# Patient Record
Sex: Female | Born: 1965 | Race: Black or African American | Hispanic: No | Smoking: Never smoker
Health system: Southern US, Community
[De-identification: ages and names within clinical notes are randomized; demographics above are authoritative.]

## PROBLEM LIST (undated history)

## (undated) DIAGNOSIS — E079 Disorder of thyroid, unspecified: Secondary | ICD-10-CM

## (undated) DIAGNOSIS — D649 Anemia, unspecified: Secondary | ICD-10-CM

## (undated) DIAGNOSIS — R011 Cardiac murmur, unspecified: Secondary | ICD-10-CM

## (undated) DIAGNOSIS — E7800 Pure hypercholesterolemia, unspecified: Secondary | ICD-10-CM

## (undated) DIAGNOSIS — K219 Gastro-esophageal reflux disease without esophagitis: Secondary | ICD-10-CM

## (undated) HISTORY — DX: Cardiac murmur, unspecified: R01.1

## (undated) HISTORY — DX: Pure hypercholesterolemia, unspecified: E78.00

## (undated) HISTORY — DX: Disorder of thyroid, unspecified: E07.9

## (undated) HISTORY — DX: Gastro-esophageal reflux disease without esophagitis: K21.9

## (undated) HISTORY — DX: Anemia, unspecified: D64.9

---

## 1998-09-11 HISTORY — PX: CHEST WALL TUMOR EXCISION: SUR562

## 2004-08-11 ENCOUNTER — Ambulatory Visit: Payer: Self-pay | Admitting: Pain Medicine

## 2004-10-18 ENCOUNTER — Ambulatory Visit: Payer: Self-pay | Admitting: Pain Medicine

## 2004-12-22 ENCOUNTER — Ambulatory Visit: Payer: Self-pay | Admitting: Pain Medicine

## 2005-02-14 ENCOUNTER — Ambulatory Visit: Payer: Self-pay | Admitting: Pain Medicine

## 2005-03-02 ENCOUNTER — Ambulatory Visit: Payer: Self-pay | Admitting: Internal Medicine

## 2005-07-13 ENCOUNTER — Ambulatory Visit: Payer: Self-pay | Admitting: Pain Medicine

## 2005-09-28 ENCOUNTER — Ambulatory Visit: Payer: Self-pay | Admitting: Pain Medicine

## 2005-12-21 ENCOUNTER — Ambulatory Visit: Payer: Self-pay | Admitting: Pain Medicine

## 2006-03-08 ENCOUNTER — Ambulatory Visit: Payer: Self-pay | Admitting: Pain Medicine

## 2006-04-10 ENCOUNTER — Ambulatory Visit: Payer: Self-pay | Admitting: Internal Medicine

## 2006-05-29 ENCOUNTER — Ambulatory Visit: Payer: Self-pay | Admitting: Pain Medicine

## 2006-08-21 ENCOUNTER — Ambulatory Visit: Payer: Self-pay | Admitting: Pain Medicine

## 2007-02-19 ENCOUNTER — Ambulatory Visit: Payer: Self-pay | Admitting: Pain Medicine

## 2007-04-12 ENCOUNTER — Ambulatory Visit: Payer: Self-pay | Admitting: Internal Medicine

## 2007-06-27 ENCOUNTER — Ambulatory Visit: Payer: Self-pay | Admitting: Pain Medicine

## 2007-10-29 ENCOUNTER — Ambulatory Visit: Payer: Self-pay | Admitting: Pain Medicine

## 2008-01-21 ENCOUNTER — Ambulatory Visit: Payer: Self-pay | Admitting: Pain Medicine

## 2008-04-23 ENCOUNTER — Ambulatory Visit: Payer: Self-pay | Admitting: Pain Medicine

## 2008-04-24 ENCOUNTER — Ambulatory Visit: Payer: Self-pay | Admitting: Internal Medicine

## 2008-08-26 ENCOUNTER — Ambulatory Visit: Payer: Self-pay | Admitting: Pain Medicine

## 2009-01-14 ENCOUNTER — Ambulatory Visit: Payer: Self-pay | Admitting: Pain Medicine

## 2009-02-16 ENCOUNTER — Ambulatory Visit: Payer: Self-pay | Admitting: Pain Medicine

## 2009-05-21 ENCOUNTER — Ambulatory Visit: Payer: Self-pay | Admitting: Internal Medicine

## 2009-07-06 ENCOUNTER — Ambulatory Visit: Payer: Self-pay | Admitting: Pain Medicine

## 2009-07-16 ENCOUNTER — Ambulatory Visit: Payer: Self-pay | Admitting: Pain Medicine

## 2009-08-09 ENCOUNTER — Ambulatory Visit: Payer: Self-pay | Admitting: Pain Medicine

## 2009-10-21 ENCOUNTER — Ambulatory Visit: Payer: Self-pay | Admitting: Pain Medicine

## 2010-05-24 ENCOUNTER — Ambulatory Visit: Payer: Self-pay | Admitting: Internal Medicine

## 2011-06-02 ENCOUNTER — Ambulatory Visit: Payer: Self-pay | Admitting: Obstetrics and Gynecology

## 2012-06-12 ENCOUNTER — Ambulatory Visit: Payer: Self-pay | Admitting: Internal Medicine

## 2012-06-13 ENCOUNTER — Ambulatory Visit: Payer: Self-pay | Admitting: Internal Medicine

## 2014-09-09 DIAGNOSIS — E785 Hyperlipidemia, unspecified: Secondary | ICD-10-CM

## 2014-09-09 DIAGNOSIS — G8929 Other chronic pain: Secondary | ICD-10-CM | POA: Insufficient documentation

## 2014-09-09 DIAGNOSIS — G894 Chronic pain syndrome: Secondary | ICD-10-CM | POA: Insufficient documentation

## 2014-09-09 DIAGNOSIS — M542 Cervicalgia: Secondary | ICD-10-CM | POA: Insufficient documentation

## 2014-09-09 DIAGNOSIS — I10 Essential (primary) hypertension: Secondary | ICD-10-CM

## 2014-09-09 HISTORY — DX: Hyperlipidemia, unspecified: E78.5

## 2014-09-09 HISTORY — DX: Essential (primary) hypertension: I10

## 2014-10-01 ENCOUNTER — Ambulatory Visit: Payer: Self-pay | Admitting: Internal Medicine

## 2014-11-17 ENCOUNTER — Ambulatory Visit: Payer: Self-pay | Admitting: Gastroenterology

## 2015-01-04 LAB — SURGICAL PATHOLOGY

## 2015-10-20 ENCOUNTER — Other Ambulatory Visit: Payer: Self-pay | Admitting: Internal Medicine

## 2015-10-20 DIAGNOSIS — Z1231 Encounter for screening mammogram for malignant neoplasm of breast: Secondary | ICD-10-CM

## 2015-10-21 ENCOUNTER — Encounter: Payer: Self-pay | Admitting: Obstetrics and Gynecology

## 2015-10-21 ENCOUNTER — Ambulatory Visit (INDEPENDENT_AMBULATORY_CARE_PROVIDER_SITE_OTHER): Payer: BLUE CROSS/BLUE SHIELD | Admitting: Obstetrics and Gynecology

## 2015-10-21 VITALS — BP 155/88 | HR 92 | Ht 63.0 in | Wt 141.9 lb

## 2015-10-21 DIAGNOSIS — N951 Menopausal and female climacteric states: Secondary | ICD-10-CM | POA: Diagnosis not present

## 2015-10-21 DIAGNOSIS — Z01419 Encounter for gynecological examination (general) (routine) without abnormal findings: Secondary | ICD-10-CM | POA: Diagnosis not present

## 2015-10-21 DIAGNOSIS — N939 Abnormal uterine and vaginal bleeding, unspecified: Secondary | ICD-10-CM | POA: Diagnosis not present

## 2015-10-21 NOTE — Patient Instructions (Signed)
1.  Pap smear is done today. 2.  Mammogram is already scheduled. 3.  No need for stool guaiac card testing for colon cancer screening because of colonoscopy this year.. 4.  Ultrasound is scheduled for  Evaluation of abnormal uterine bleeding. 5.  Return in 2 weeks for review of ultrasound and endometrial biopsy

## 2015-10-21 NOTE — Progress Notes (Signed)
Patient ID: Gail Harper, female   DOB: 17-Nov-1965, 50 y.o.   MRN: [id number] ANNUAL PREVENTATIVE CARE GYN  ENCOUNTER NOTE  Subjective:       Gail Harper is a 50 y.o. [phone] female here for a routine annual gynecologic exam.  Current complaints: 1.  04/2015- hot flashes at nites, breast tenderness, irregular cycles   Patient presents for annual exam and pap. Last pap was 4-5 years ago which was normal. Denies history of abnormal pap smears. LMP 10/09/15. Prior to September the patient had a regular cycle, occurring every 28 days, lasting 4-5 days with a heavy flow throughout but no dysmenorrhea. In the past two years she missed two periods, otherwise regular cycles. The patient had a regular period in September 2016, but did not have a period in October-December. Starting 09/20/15 she had two days of semi-heavy bleeding. She then had bleeding on 10/09/15 and has spotting since then. She notes possibly having hot flashes in August-September but she is unsure of this. The patient has increased urinary frequency only when anxious, otherwise no urinary complaints. She denies nausea, vomiting, and diarrhea. She occasionally has to strain to have a BM.   Gynecologic History Patient's last menstrual period was 10/09/2015 (approximate). Contraception: none Last Pap: 2014. Results were: normal Last mammogram: 2016. Results were: normal- thru pcp  Obstetric History OB History  Gravida Para Term Preterm AB SAB TAB Ectopic Multiple Living  # Outcome Date GA Lbr Len/2nd Weight Sex Delivery Anes PTL Lv  2 Term 1989   7 lb 1.8 oz (3.225 kg) F Vag-Spont   Y  1 Term 1985   6 lb 6.4 oz (2.903 kg) F Vag-Spont   Y      Past Medical History  Diagnosis Date  . Anemia   . High cholesterol     Past Surgical History  Procedure Laterality Date  . Chest wall tumor excision  2000    b9    No current outpatient prescriptions on file prior to visit.   No current facility-administered  medications on file prior to visit.    No Known Allergies  Social History   Social History  . Marital Status: Married    Spouse Name: N/A  . Number of Children: N/A  . Years of Education: N/A   Occupational History  . Not on file.   Social History Main Topics  . Smoking status: Never Smoker   . Smokeless tobacco: Not on file  . Alcohol Use: No  . Drug Use: No  . Sexual Activity: No   Other Topics Concern  . Not on file   Social History Narrative  . No narrative on file    Family History  Problem Relation Age of Onset  . Heart disease Sister   . Cancer Neg Hx   . Diabetes Neg Hx     The following portions of the patient's history were reviewed and updated as appropriate: allergies, current medications, past family history, past medical history, past social history, past surgical history and problem list.  Review of Systems Review of Systems  Constitutional: Positive for diaphoresis. Negative for fever, chills, weight loss and malaise/fatigue.  Gastrointestinal: Positive for constipation. Negative for nausea, vomiting, abdominal pain and diarrhea.  Genitourinary: Positive for frequency. Negative for dysuria, urgency, hematuria and flank pain.  Psychiatric/Behavioral: The patient is nervous/anxious.     Objective:   BP 155/88 mmHg  Pulse 92  Ht  (1.6 m)  Wt 141 lb 14.4 oz (64.365 kg)  BMI 25.14 kg/m2  LMP 10/09/2015 (Approximate) CONSTITUTIONAL: Well-developed, well-nourished female in no acute distress.  PSYCHIATRIC: Normal mood and affect, slightly anxious. Normal behavior. Normal judgment and thought content. NEUROLGIC: Alert and oriented to person, place, and time. Normal muscle tone coordination. No cranial nerve deficit noted. NECK: Normal range of motion, supple, no masses.  Normal thyroid.  CARDIOVASCULAR: Normal heart rate noted, regular rhythm, no murmur. RESPIRATORY: Clear to auscultation bilaterally. Effort and breath sounds normal, no problems  with respiration noted. BREASTS: Symmetric in size. No masses, skin changes, nipple drainage, or lymphadenopathy. ABDOMEN: Soft, normal bowel sounds, no distention noted.  No tenderness, rebound or guarding.  PELVIC:   External Genitalia: Normal  BUS: Normal  Vagina: Normal; residual blood in vaginal vault  Cervix: Normal; no lesions  Uterus: Normal; midplane, normal size, shape, mobile, nontender  Adnexa: Normal  RV: External Exam NormaI, No Rectal Masses and Normal Sphincter tone  LYMPHATIC: No Axillary, Supraclavicular, or Inguinal Adenopathy.    Assessment:   Annual gynecologic examination 50 y.o. Contraception: none bmi-25 Climacteric. Abnormal uterine bleeding  Plan:  Pap: Pap Co Test Mammogram: thru pcp Stool Guaiac Testing:  Not Indicated- colonoscopy- 11/2014-polyps removed Labs: thru pcp Routine preventative health maintenance measures emphasized: Exercise/Diet/Weight control, Tobacco Warnings, Alcohol/Substance use risks, Stress Management and Safe Sex  1. The patient will schedule a transvaginal ultrasound for further evaluation of AUB 2. Return to the clinic in 2 weeks for review of ultrasound results and endometrial biopsy.  Darol Destine, CMA Gail Lynn PA-S Herold Harms, MD    I have seen, interviewed, and examined the patient in conjunction with the [facility].A. student and affirm the diagnosis and management plan. Martin A. DeFrancesco, MD, FACOG   Note: This dictation was prepared with Dragon dictation along with smaller phrase technology. Any transcriptional errors that result from this process are unintentional.

## 2015-10-27 ENCOUNTER — Ambulatory Visit (INDEPENDENT_AMBULATORY_CARE_PROVIDER_SITE_OTHER): Payer: BLUE CROSS/BLUE SHIELD

## 2015-10-27 DIAGNOSIS — N939 Abnormal uterine and vaginal bleeding, unspecified: Secondary | ICD-10-CM | POA: Diagnosis not present

## 2015-10-29 LAB — PAP IG AND HPV HIGH-RISK
HPV, high-risk: NEGATIVE
PAP Smear Comment: 0

## 2015-11-02 ENCOUNTER — Ambulatory Visit
Admission: RE | Admit: 2015-11-02 | Discharge: 2015-11-02 | Disposition: A | Discharge status: Home / Self Care | Payer: BLUE CROSS/BLUE SHIELD | Source: Ambulatory Visit | Attending: Internal Medicine | Admitting: Internal Medicine

## 2015-11-02 DIAGNOSIS — Z1231 Encounter for screening mammogram for malignant neoplasm of breast: Secondary | ICD-10-CM | POA: Diagnosis not present

## 2015-11-03 ENCOUNTER — Other Ambulatory Visit: Payer: Self-pay | Admitting: Internal Medicine

## 2015-11-03 ENCOUNTER — Encounter: Payer: Self-pay | Admitting: Obstetrics and Gynecology

## 2015-11-03 DIAGNOSIS — R928 Other abnormal and inconclusive findings on diagnostic imaging of breast: Secondary | ICD-10-CM

## 2015-11-04 ENCOUNTER — Ambulatory Visit (INDEPENDENT_AMBULATORY_CARE_PROVIDER_SITE_OTHER): Payer: BLUE CROSS/BLUE SHIELD | Admitting: Obstetrics and Gynecology

## 2015-11-04 ENCOUNTER — Encounter: Payer: Self-pay | Admitting: Obstetrics and Gynecology

## 2015-11-04 VITALS — BP 146/80 | HR 73 | Ht 63.0 in | Wt 141.1 lb

## 2015-11-04 DIAGNOSIS — N83202 Unspecified ovarian cyst, left side: Secondary | ICD-10-CM

## 2015-11-04 DIAGNOSIS — R9389 Abnormal findings on diagnostic imaging of other specified body structures: Secondary | ICD-10-CM | POA: Insufficient documentation

## 2015-11-04 DIAGNOSIS — N939 Abnormal uterine and vaginal bleeding, unspecified: Secondary | ICD-10-CM | POA: Diagnosis not present

## 2015-11-04 DIAGNOSIS — R938 Abnormal findings on diagnostic imaging of other specified body structures: Secondary | ICD-10-CM

## 2015-11-04 NOTE — Progress Notes (Signed)
Chief complaint: 1. Abnormal uterine bleeding 2. Follow-up on ultrasound 3. Endometrial biopsy  Recent ultrasound demonstrated a normal uterus with a thickened cystic endometrium. Biopsy is recommended. A simple left ovarian cyst is also identified. These results were discussed with patient.  OBJECTIVE:. BP 146/80 mmHg  Pulse 73  Ht  (1.6 m)  Wt 141 lb 1.6 oz (64.003 kg)  BMI 25.00 kg/m2  LMP 10/31/2015 (Exact Date) Pleasant African-American female in no acute distress Pelvic exam: Bimanual exam-midplane uterus, normal size and shape, mobile, nontender; adnexa nonpalpable  PROCEDURE: Endometrial biopsy Patient is consented for the procedure. Indications: Abnormal uterine bleeding; thickened endometrium on ultrasound Bimanual exam-midplane uterus, normal size and shape Uterine sounding-8.5 cm Endometrial biopsy-lush endometrium on a single pass with a 3 mm Mylex pipette EBL-minimal Procedure well-tolerated Specimen sent to pathology.  ASSESSMENT: 1. Thickened endometrium on ultrasound 2. Abnormal uterine bleeding 3. Simple left ovarian cyst  PLAN: 1. Endometrial biopsy as noted 2. Return in 10 days for follow-up and further management planning 3. Post biopsy instructions given.  A total of 15 minutes were spent face-to-face with the patient during this encounter and over half of that time dealt with counseling and coordination of care.  Herold Harms, MD  Note: This dictation was prepared with Dragon dictation along with smaller phrase technology. Any transcriptional errors that result from this process are unintentional.

## 2015-11-04 NOTE — Patient Instructions (Signed)
ENDOMETRIAL BIOPSY POST-PROCEDURE INSTRUCTIONS  1. You may take Ibuprofen, Aleve or Tylenol for pain if needed.  Cramping should resolve within in 24 hours.  2. You may have a small amount of spotting.  You should wear a mini pad for the next few days.  3. You may have intercourse after 24 hours.  4. You need to call if you have any pelvic pain, fever, heavy bleeding or foul smelling vaginal discharge.  5. Shower or bathe as normal   We will discuss the results at your follow-up appointment in 10 days.

## 2015-11-05 ENCOUNTER — Ambulatory Visit
Admission: RE | Admit: 2015-11-05 | Discharge: 2015-11-05 | Disposition: A | Discharge status: Home / Self Care | Payer: BLUE CROSS/BLUE SHIELD | Source: Ambulatory Visit | Attending: Internal Medicine | Admitting: Internal Medicine

## 2015-11-05 DIAGNOSIS — R928 Other abnormal and inconclusive findings on diagnostic imaging of breast: Secondary | ICD-10-CM

## 2015-11-05 DIAGNOSIS — N6489 Other specified disorders of breast: Secondary | ICD-10-CM | POA: Insufficient documentation

## 2015-11-08 LAB — PATHOLOGY

## 2015-11-16 ENCOUNTER — Encounter: Payer: Self-pay | Admitting: Obstetrics and Gynecology

## 2015-11-16 ENCOUNTER — Ambulatory Visit (INDEPENDENT_AMBULATORY_CARE_PROVIDER_SITE_OTHER): Payer: BLUE CROSS/BLUE SHIELD | Admitting: Obstetrics and Gynecology

## 2015-11-16 VITALS — BP 132/79 | HR 79 | Wt 141.3 lb

## 2015-11-16 DIAGNOSIS — N939 Abnormal uterine and vaginal bleeding, unspecified: Secondary | ICD-10-CM | POA: Diagnosis not present

## 2015-11-16 DIAGNOSIS — N951 Menopausal and female climacteric states: Secondary | ICD-10-CM | POA: Diagnosis not present

## 2015-11-16 NOTE — Progress Notes (Signed)
Chief complaint: 1. Abnormal uterine bleeding 2. Vasomotor symptoms  Patient presents for follow-up on endometrial biopsy. Ultrasound demonstrated a thickened endometrium in a simple left ovarian cyst.   Endometrial biopsy demonstrating secretory endometrium without hyperplasia or carcinoma.  Patient is experiencing occasional vasomotor symptoms.  ASSESSMENT: 1. Abnormal uterine bleeding with benign endometrial biopsy. Findings are consistent with ovulatory function. 2. Vasomotor symptoms consistent with climacteric phase  PLAN: 1. Menstrual calendar monitoring 2. Return in September for review bleeding pattern and vasomotor symptoms 3. Reassurance is given regarding the benign nature of the results of biopsy.   A total of 15 minutes were spent face-to-face with the patient during this encounter and over half of that time dealt with counseling and coordination of care.  Herold HarmsMartin A Sekou Zuckerman, MD  Note: This dictation was prepared with Dragon dictation along with smaller phrase technology. Any transcriptional errors that result from this process are unintentional.

## 2015-11-16 NOTE — Patient Instructions (Signed)
1. Maintain menstrual calendar monitoring 2. Follow-up in September 2017 for review bleeding pattern and vasomotor symptoms

## 2016-05-17 ENCOUNTER — Ambulatory Visit (INDEPENDENT_AMBULATORY_CARE_PROVIDER_SITE_OTHER): Payer: BLUE CROSS/BLUE SHIELD | Admitting: Obstetrics and Gynecology

## 2016-05-17 ENCOUNTER — Encounter: Payer: Self-pay | Admitting: Obstetrics and Gynecology

## 2016-05-17 VITALS — BP 136/80 | HR 78 | Ht 63.0 in | Wt 146.2 lb

## 2016-05-17 DIAGNOSIS — R938 Abnormal findings on diagnostic imaging of other specified body structures: Secondary | ICD-10-CM

## 2016-05-17 DIAGNOSIS — R9389 Abnormal findings on diagnostic imaging of other specified body structures: Secondary | ICD-10-CM

## 2016-05-17 DIAGNOSIS — N950 Postmenopausal bleeding: Secondary | ICD-10-CM

## 2016-05-17 NOTE — Patient Instructions (Signed)
1. Ultrasound is scheduled to assess endometrial thickness 2. Continue menstrual calendar monitoring 3. Return in 6 months for follow-up 4. Results from ultrasound will be given through my chart. If there is a need for endometrial biopsy you will be notified regarding this need.

## 2016-05-17 NOTE — Progress Notes (Signed)
Chief complaint: 1. History of postmenopausal bleeding 2. History of thickened endometrium  Endometrial biopsy in March 2017 was benign. Ultrasound at that time demonstrated a thickened cystic endometrium. Menstrual calendar monitoring since March has demonstrated occasional spotting with that heavy bleeding. She is experiencing occasional vasomotor symptoms.  Past medical history, past surgical history, problem list, medications, and allergies are reviewed  OBJECTIVE: BP 136/80   Pulse 78   Ht 5\' 3"  (1.6 m)   Wt 146 lb 3.2 oz (66.3 kg)   LMP 02/10/2016 (Approximate) Comment: spotting only  BMI 25.90 kg/m  Physical exam-deferred  ASSESSMENT: 1. History of postmenopausal bleeding and benign endometrial biopsy 6 months ago 2. History of thickened cystic endometrium on ultrasound 6 months ago  PLAN: 1. Pelvic ultrasound 2. Continue menstrual calendar monitoring 3. Return in 6 months for follow-up 4. If the endometrium is still thickened on ultrasound, we will have patient return for repeat endometrial biopsy  A total of 15 minutes were spent face-to-face with the patient during this encounter and over half of that time dealt with counseling and coordination of care.  Herold HarmsMartin A Tredarius Cobern, MD  Note: This dictation was prepared with Dragon dictation along with smaller phrase technology. Any transcriptional errors that result from this process are unintentional.

## 2016-05-18 ENCOUNTER — Ambulatory Visit: Payer: BLUE CROSS/BLUE SHIELD | Admitting: Obstetrics and Gynecology

## 2016-05-23 ENCOUNTER — Encounter: Payer: Self-pay | Admitting: Obstetrics and Gynecology

## 2016-05-24 ENCOUNTER — Ambulatory Visit (INDEPENDENT_AMBULATORY_CARE_PROVIDER_SITE_OTHER): Payer: BLUE CROSS/BLUE SHIELD

## 2016-05-24 DIAGNOSIS — R938 Abnormal findings on diagnostic imaging of other specified body structures: Secondary | ICD-10-CM | POA: Diagnosis not present

## 2016-05-24 DIAGNOSIS — R9389 Abnormal findings on diagnostic imaging of other specified body structures: Secondary | ICD-10-CM

## 2016-05-24 DIAGNOSIS — N950 Postmenopausal bleeding: Secondary | ICD-10-CM

## 2016-06-15 ENCOUNTER — Ambulatory Visit: Payer: BLUE CROSS/BLUE SHIELD | Admitting: Obstetrics and Gynecology

## 2016-06-27 ENCOUNTER — Ambulatory Visit (INDEPENDENT_AMBULATORY_CARE_PROVIDER_SITE_OTHER): Payer: BLUE CROSS/BLUE SHIELD | Admitting: Obstetrics and Gynecology

## 2016-06-27 ENCOUNTER — Encounter: Payer: Self-pay | Admitting: Obstetrics and Gynecology

## 2016-06-27 VITALS — BP 147/79 | HR 76 | Ht 63.0 in | Wt 144.1 lb

## 2016-06-27 DIAGNOSIS — N950 Postmenopausal bleeding: Secondary | ICD-10-CM

## 2016-06-27 DIAGNOSIS — R938 Abnormal findings on diagnostic imaging of other specified body structures: Secondary | ICD-10-CM | POA: Diagnosis not present

## 2016-06-27 DIAGNOSIS — R9389 Abnormal findings on diagnostic imaging of other specified body structures: Secondary | ICD-10-CM

## 2016-06-27 NOTE — Patient Instructions (Signed)
1. Endometrial biopsy is done today 2. Maintain menstrual calendar monitoring 3. Return in 6 months for follow-up on postmenopausal bleeding   Endometrial Biopsy, Care After Refer to this sheet in the next few weeks. These instructions provide you with information on caring for yourself after your procedure. Your health care provider may also give you more specific instructions. Your treatment has been planned according to current medical practices, but problems sometimes occur. Call your health care provider if you have any problems or questions after your procedure. WHAT TO EXPECT AFTER THE PROCEDURE After your procedure, it is typical to have the following:  You may have mild cramping and a small amount of vaginal bleeding for a few days after the procedure. This is normal. HOME CARE INSTRUCTIONS  Only take over-the-counter or prescription medicine as directed by your health care provider.  Do not douche, use tampons, or have sexual intercourse until your health care provider approves.  Follow your health care provider's instructions regarding any activity restrictions, such as strenuous exercise or heavy lifting. SEEK MEDICAL CARE IF:  You have heavy bleeding or bleeding longer than 2 days after the procedure.  You have bad smelling drainage from your vagina.  You have a fever and chills.  Youhave severe lower stomach (abdominal) pain. SEEK IMMEDIATE MEDICAL CARE IF:  You have severe cramps in your stomach or back.  You pass large blood clots.  Your bleeding increases.  You become weak or lightheaded, or you pass out.   This information is not intended to replace advice given to you by your health care provider. Make sure you discuss any questions you have with your health care provider.   Document Released: 06/18/2013 Document Reviewed: 06/18/2013 Elsevier Interactive Patient Education [employer]2016 Elsevier Inc.

## 2016-06-27 NOTE — Progress Notes (Signed)
Chief complaint: 1. Postmenopausal bleeding 2. Thickened endometrium on ultrasound  Endometrial Biopsy Procedure Note  Pre-operative Diagnosis: Postmenopausal bleeding; thickened endometrium  Post-operative Diagnosis: Same  Procedure Details   Urine pregnancy test was not done.  The risks (including infection, bleeding, pain, and uterine perforation) and benefits of the procedure were explained to the patient and Verbal informed consent was obtained.  Antibiotic prophylaxis against endocarditis was not indicated.   The patient was placed in the dorsal lithotomy position.  Bimanual exam showed the uterus to be in the neutral position.  A Graves' speculum inserted in the vagina, and the cervix prepped with povidone iodine.  Endocervical curettage with a Kevorkian curette was not performed.   A sharp tenaculum was not applied to the anterior lip of the cervix for stabilization.  A sterile uterine sound was used to sound the uterus to a depth of 8cm.  A Mylex 3mm curette was used to sample the endometrium.  Sample was sent for pathologic examination.  Condition: Stable  Complications: None  Plan:  The patient was advised to call for any fever or for prolonged or severe pain or bleeding. She was advised to use OTC acetaminophen and OTC ibuprofen as needed for mild to moderate pain. She was advised to avoid vaginal intercourse for 48 hours or until the bleeding has completely stopped.  Attending Physician Documentation: Prentice DockerMartin A Noelie Renfrow, MD   OBJECTIVE: BP (!) 147/79   Pulse 76   Ht 5\' 3"  (1.6 m)   Wt 144 lb 1.6 oz (65.4 kg)   LMP 05/22/2016   BMI 25.53 kg/m  Endometrial biopsy-see note  ASSESSMENT: 1. Postmenopausal bleeding 2. Thickened endometrium on ultrasound  PLAN: 1. Endometrial biopsy performed 2. Maintain menstrual calendar monitoring 3. Return in 6 months for follow-up on postmenopausal bleeding 4. If bleeding abnormality persists, consider  D&C/hysteroscopy  Herold HarmsMartin A Hamna Asa, MD  Note: This dictation was prepared with Dragon dictation along with smaller phrase technology. Any transcriptional errors that result from this process are unintentional.   Note: This dictation was prepared with Dragon dictation along with smaller phrase technology. Any transcriptional errors that result from this process are unintentional.

## 2016-06-29 LAB — PATHOLOGY

## 2016-10-23 ENCOUNTER — Encounter: Payer: Self-pay | Admitting: Obstetrics and Gynecology

## 2016-10-23 NOTE — Progress Notes (Signed)
Patient ID: Gail Harper, female   DOB: [birth year], 51 y.o.   MRN: [id number] ANNUAL PREVENTATIVE CARE GYN  ENCOUNTER NOTE  Subjective:       Gail Harper is a 51 y.o. [phone] female here for a routine annual gynecologic exam.  Current complaints: 1. Left breast itching around the nipple;Symptoms began approximately 2 weeks ago; no recent treatment; due for mammogram  2. husband died 09/02/16; complications of dementia 3. History of abnormal uterine bleeding; endometrial biopsy in October 2017 notable for proliferative endometrium; menstrual calendar monitoring demonstrates 3 months of amenorrhea in November/December/January and a normal cycle in February   Gynecologic History No LMP recorded. Contraception: none Last Pap: 10/21/2015 neg/neg. Results were: normal Last mammogram: 11/02/2015 birad 0, 11/05/2015 dx of left breast birad 1. Results were:  Obstetric History OB History  Gravida Para Term Preterm AB Living  2 2 2     2   SAB TAB Ectopic Multiple Live Births          2    # Outcome Date GA Lbr Len/2nd Weight Sex Delivery Anes PTL Lv  2 Term 1989   7 lb 1.8 oz (3.225 kg) F Vag-Spont   LIV  1 Term 1985   6 lb 6.4 oz (2.903 kg) F Vag-Spont   LIV      Past Medical History:  Diagnosis Date  . Anemia   . High cholesterol     Past Surgical History:  Procedure Laterality Date  . 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    Current Outpatient Prescriptions on File Prior to Visit  Medication Sig Dispense Refill  . atorvastatin (LIPITOR) 10 MG tablet TAKE 1 TABLET BY MOUTH EVERY DAY FOR CHOLESTEROL  1  . ferrous sulfate 325 (65 FE) MG tablet Take 325 mg by mouth daily with breakfast.     No current facility-administered medications on file prior to visit.     No Known Allergies  Social History   Social History  . Marital status: Married    Spouse name: N/A  . Number of children: N/A  . Years of education: N/A   Occupational History  . Not on file.   Social History  Main Topics  . Smoking status: Never Smoker  . Smokeless tobacco: Never Used  . Alcohol use No  . Drug use: No  . Sexual activity: No   Other Topics Concern  . Not on file   Social History Narrative  . No narrative on file    Family History  Problem Relation Age of Onset  . Heart disease Sister   . Cancer Neg Hx   . Diabetes Neg Hx   . Breast cancer Neg Hx     The following portions of the patient's history were reviewed and updated as appropriate: allergies, current medications, past family history, past medical history, past social history, past surgical history and problem list.  Review of Systems Review of Systems  Constitutional: Negative for chills, diaphoresis, fever, malaise/fatigue and weight loss.  Eyes: Negative.   Respiratory: Negative.   Cardiovascular: Negative.   Gastrointestinal: Positive for constipation. Negative for abdominal pain, diarrhea, nausea and vomiting.  Genitourinary: Negative.  Negative for dysuria, flank pain, frequency, hematuria and urgency.  Musculoskeletal: Negative.   Skin: Positive for itching.       Breast areola itching  Neurological: Negative.   Endo/Heme/Allergies: Negative.     Objective:    BP (!) 146/73   Pulse 81   Ht 5'  3" (1.6 m)   Wt 144 lb 11.2 oz (65.6 kg)   LMP 10/17/2016 (Exact Date)   BMI 25.63 kg/m   CONSTITUTIONAL: Well-developed, well-nourished female in no acute distress.  PSYCHIATRIC: Normal mood and affect, slightly anxious. Normal behavior. Normal judgment and thought content. NEUROLGIC: Alert and oriented to person, place, and time. Normal muscle tone coordination. No cranial nerve deficit noted. NECK: Normal range of motion, supple, no masses.  Normal thyroid.  CARDIOVASCULAR: Normal heart rate noted, regular rhythm, no murmur. RESPIRATORY: Clear to auscultation bilaterally. Effort and breath sounds normal, no problems with respiration noted. BREASTS: Symmetric in size. No masses,nipple drainage, or  lymphadenopathy. The left peri areola demonstrates slight hyperemia in the upper outer quadrant ABDOMEN: Soft, normal bowel sounds, no distention noted.  No tenderness, rebound or guarding.  PELVIC:   External Genitalia: Normal  BUS: Normal  Vagina: Normal; no discharge  Cervix: Normal; no lesions; parous, no cervical motion tenderness  Uterus: Normal; midplane, normal size, shape, mobile, nontender  Adnexa: Normal  RV: External Exam NormaI, No Rectal Masses and Normal Sphincter tone from stool rectal vault LYMPHATIC: No Axillary, Supraclavicular, or Inguinal Adenopathy. MUSCULOSKELETAL: No tenderness or edema    Assessment:   Annual gynecologic examination 51 y.o. Contraception: none bmi-25 Climacteric. Abnormal uterine bleeding; menstrual calendar monitoring normal since endometrial biopsy in October 2017 Left breast itching with minimal hyperemia present Loss of spouse December 2017 due to complications of dementia  Plan:  Pap: due 2020 Mammogram: thru pcp Stool Guaiac Testing: stool cards Labs: thru pcp Routine preventative health maintenance measures emphasized: Exercise/Diet/Weight control, Tobacco Warnings, Alcohol/Substance use risks, Stress Management and Safe Sex  Continue menstrual calendar monitoring Continue with Tums as source of calcium Apply hydrocortisone cream topically to the left breast several times a day for the next 7-10 days; if symptoms persist return for reevaluation   Darol Destinerystal Anjolaoluwa Siguenza, CMA  Herold HarmsMartin A Defrancesco, MD  Note: This dictation was prepared with Dragon dictation along with smaller phrase technology. Any transcriptional errors that result from this process are unintentional.

## 2016-10-24 ENCOUNTER — Encounter: Payer: Self-pay | Admitting: Obstetrics and Gynecology

## 2016-10-24 ENCOUNTER — Ambulatory Visit (INDEPENDENT_AMBULATORY_CARE_PROVIDER_SITE_OTHER): Payer: BLUE CROSS/BLUE SHIELD | Admitting: Obstetrics and Gynecology

## 2016-10-24 VITALS — BP 146/73 | HR 81 | Ht 63.0 in | Wt 144.7 lb

## 2016-10-24 DIAGNOSIS — Z01419 Encounter for gynecological examination (general) (routine) without abnormal findings: Secondary | ICD-10-CM | POA: Diagnosis not present

## 2016-10-24 DIAGNOSIS — Z1211 Encounter for screening for malignant neoplasm of colon: Secondary | ICD-10-CM | POA: Diagnosis not present

## 2016-10-24 DIAGNOSIS — N939 Abnormal uterine and vaginal bleeding, unspecified: Secondary | ICD-10-CM | POA: Diagnosis not present

## 2016-10-24 DIAGNOSIS — Z1231 Encounter for screening mammogram for malignant neoplasm of breast: Secondary | ICD-10-CM

## 2016-10-24 DIAGNOSIS — Z1239 Encounter for other screening for malignant neoplasm of breast: Secondary | ICD-10-CM

## 2016-10-24 DIAGNOSIS — N951 Menopausal and female climacteric states: Secondary | ICD-10-CM

## 2016-10-24 NOTE — Patient Instructions (Signed)
1. No Pap smear needed until 2020 2. Mammogram ordered 3. Stool guaiac cards are given 4. Screening labs are done through primary care 5. Continue taking Tums as a source of calcium supplementation 6. Continue with healthy eating and exercise 7. Return in 1 year for physical 8. Continue menstrual calendar monitoring-history of irregular menstrual cycles with benign biopsy in October 2017 9. Recommend hydrocortisone cream to be applied topically to the left breast several times a day for the next week to 10 days; if symptoms persist return for follow-up  Health Maintenance, Female Introduction Adopting a healthy lifestyle and getting preventive care can go a long way to promote health and wellness. Talk with your health care provider about what schedule of regular examinations is right for you. This is a good chance for you to check in with your provider about disease prevention and staying healthy. In between checkups, there are plenty of things you can do on your own. Experts have done a lot of research about which lifestyle changes and preventive measures are most likely to keep you healthy. Ask your health care provider for more information. Weight and diet Eat a healthy diet  Be sure to include plenty of vegetables, fruits, low-fat dairy products, and lean protein.  Do not eat a lot of foods high in solid fats, added sugars, or salt.  Get regular exercise. This is one of the most important things you can do for your health.  Most adults should exercise for at least 150 minutes each week. The exercise should increase your heart rate and make you sweat (moderate-intensity exercise).  Most adults should also do strengthening exercises at least twice a week. This is in addition to the moderate-intensity exercise. Maintain a healthy weight  Body mass index (BMI) is a measurement that can be used to identify possible weight problems. It estimates body fat based on height and weight. Your  health care provider can help determine your BMI and help you achieve or maintain a healthy weight.  For females 20 years of age and older:  A BMI below 18.5 is considered underweight.  A BMI of 18.5 to 24.9 is normal.  A BMI of 25 to 29.9 is considered overweight.  A BMI of 30 and above is considered obese. Watch levels of cholesterol and blood lipids  You should start having your blood tested for lipids and cholesterol at 53 years of age, then have this test every 5 years.  You may need to have your cholesterol levels checked more often if:  Your lipid or cholesterol levels are high.  You are older than 53 years of age.  You are at high risk for heart disease. Cancer screening Lung Cancer  Lung cancer screening is recommended for adults 48-38 years old who are at high risk for lung cancer because of a history of smoking.  A yearly low-dose CT scan of the lungs is recommended for people who:  Currently smoke.  Have quit within the past 15 years.  Have at least a 30-pack-year history of smoking. A pack year is smoking an average of one pack of cigarettes a day for 1 year.  Yearly screening should continue until it has been 15 years since you quit.  Yearly screening should stop if you develop a health problem that would prevent you from having lung cancer treatment. Breast Cancer  Practice breast self-awareness. This means understanding how your breasts normally appear and feel.  It also means doing regular breast self-exams. Let your  health care provider know about any changes, no matter how small.  If you are in your 20s or 30s, you should have a clinical breast exam (CBE) by a health care provider every 1-3 years as part of a regular health exam.  If you are 57 or older, have a CBE every year. Also consider having a breast X-ray (mammogram) every year.  If you have a family history of breast cancer, talk to your health care provider about genetic screening.  If you  are at high risk for breast cancer, talk to your health care provider about having an MRI and a mammogram every year.  Breast cancer gene (BRCA) assessment is recommended for women who have family members with BRCA-related cancers. BRCA-related cancers include:  Breast.  Ovarian.  Tubal.  Peritoneal cancers.  Results of the assessment will determine the need for genetic counseling and BRCA1 and BRCA2 testing. Cervical Cancer  Your health care provider may recommend that you be screened regularly for cancer of the pelvic organs (ovaries, uterus, and vagina). This screening involves a pelvic examination, including checking for microscopic changes to the surface of your cervix (Pap test). You may be encouraged to have this screening done every 3 years, beginning at age 70.  For women ages 60-65, health care providers may recommend pelvic exams and Pap testing every 3 years, or they may recommend the Pap and pelvic exam, combined with testing for human papilloma virus (HPV), every 5 years. Some types of HPV increase your risk of cervical cancer. Testing for HPV may also be done on women of any age with unclear Pap test results.  Other health care providers may not recommend any screening for nonpregnant women who are considered low risk for pelvic cancer and who do not have symptoms. Ask your health care provider if a screening pelvic exam is right for you.  If you have had past treatment for cervical cancer or a condition that could lead to cancer, you need Pap tests and screening for cancer for at least 20 years after your treatment. If Pap tests have been discontinued, your risk factors (such as having a new sexual partner) need to be reassessed to determine if screening should resume. Some women have medical problems that increase the chance of getting cervical cancer. In these cases, your health care provider may recommend more frequent screening and Pap tests. Colorectal Cancer  This type of  cancer can be detected and often prevented.  Routine colorectal cancer screening usually begins at 53 years of age and continues through 53 years of age.  Your health care provider may recommend screening at an earlier age if you have risk factors for colon cancer.  Your health care provider may also recommend using home test kits to check for hidden blood in the stool.  A small camera at the end of a tube can be used to examine your colon directly (sigmoidoscopy or colonoscopy). This is done to check for the earliest forms of colorectal cancer.  Routine screening usually begins at age 79.  Direct examination of the colon should be repeated every 5-10 years through 53 years of age. However, you may need to be screened more often if early forms of precancerous polyps or small growths are found. Skin Cancer  Check your skin from head to toe regularly.  Tell your health care provider about any new moles or changes in moles, especially if there is a change in a mole's shape or color.  Also tell your  health care provider if you have a mole that is larger than the size of a pencil eraser.  Always use sunscreen. Apply sunscreen liberally and repeatedly throughout the day.  Protect yourself by wearing long sleeves, pants, a wide-brimmed hat, and sunglasses whenever you are outside. Heart disease, diabetes, and high blood pressure  High blood pressure causes heart disease and increases the risk of stroke. High blood pressure is more likely to develop in:  People who have blood pressure in the high end of the normal range (130-139/85-89 mm Hg).  People who are overweight or obese.  People who are African American.  If you are 69-1 years of age, have your blood pressure checked every 3-5 years. If you are 35 years of age or older, have your blood pressure checked every year. You should have your blood pressure measured twice-once when you are at a hospital or clinic, and once when you are not  at a hospital or clinic. Record the average of the two measurements. To check your blood pressure when you are not at a hospital or clinic, you can use:  An automated blood pressure machine at a pharmacy.  A home blood pressure monitor.  If you are between 41 years and 8 years old, ask your health care provider if you should take aspirin to prevent strokes.  Have regular diabetes screenings. This involves taking a blood sample to check your fasting blood sugar level.  If you are at a normal weight and have a low risk for diabetes, have this test once every three years after 53 years of age.  If you are overweight and have a high risk for diabetes, consider being tested at a younger age or more often. Preventing infection Hepatitis B  If you have a higher risk for hepatitis B, you should be screened for this virus. You are considered at high risk for hepatitis B if:  You were born in a country where hepatitis B is common. Ask your health care provider which countries are considered high risk.  Your parents were born in a high-risk country, and you have not been immunized against hepatitis B (hepatitis B vaccine).  You have HIV or AIDS.  You use needles to inject street drugs.  You live with someone who has hepatitis B.  You have had sex with someone who has hepatitis B.  You get hemodialysis treatment.  You take certain medicines for conditions, including cancer, organ transplantation, and autoimmune conditions. Hepatitis C  Blood testing is recommended for:  Everyone born from [birth year] through 1967.  Anyone with known risk factors for hepatitis C. Sexually transmitted infections (STIs)  You should be screened for sexually transmitted infections (STIs) including gonorrhea and chlamydia if:  You are sexually active and are younger than 53 years of age.  You are older than 53 years of age and your health care provider tells you that you are at risk for this type of  infection.  Your sexual activity has changed since you were last screened and you are at an increased risk for chlamydia or gonorrhea. Ask your health care provider if you are at risk.  If you do not have HIV, but are at risk, it may be recommended that you take a prescription medicine daily to prevent HIV infection. This is called pre-exposure prophylaxis (PrEP). You are considered at risk if:  You are sexually active and do not regularly use condoms or know the HIV status of your partner(s).  You take drugs  by injection.  You are sexually active with a partner who has HIV. Talk with your health care provider about whether you are at high risk of being infected with HIV. If you choose to begin PrEP, you should first be tested for HIV. You should then be tested every 3 months for as long as you are taking PrEP. Pregnancy  If you are premenopausal and you may become pregnant, ask your health care provider about preconception counseling.  If you may become pregnant, take 400 to 800 micrograms (mcg) of folic acid every day.  If you want to prevent pregnancy, talk to your health care provider about birth control (contraception). Osteoporosis and menopause  Osteoporosis is a disease in which the bones lose minerals and strength with aging. This can result in serious bone fractures. Your risk for osteoporosis can be identified using a bone density scan.  If you are 62 years of age or older, or if you are at risk for osteoporosis and fractures, ask your health care provider if you should be screened.  Ask your health care provider whether you should take a calcium or vitamin D supplement to lower your risk for osteoporosis.  Menopause may have certain physical symptoms and risks.  Hormone replacement therapy may reduce some of these symptoms and risks. Talk to your health care provider about whether hormone replacement therapy is right for you. Follow these instructions at home:  Schedule  regular health, dental, and eye exams.  Stay current with your immunizations.  Do not use any tobacco products including cigarettes, chewing tobacco, or electronic cigarettes.  If you are pregnant, do not drink alcohol.  If you are breastfeeding, limit how much and how often you drink alcohol.  Limit alcohol intake to no more than 1 drink per day for nonpregnant women. One drink equals 12 ounces of beer, 5 ounces of wine, or 1 ounces of hard liquor.  Do not use street drugs.  Do not share needles.  Ask your health care provider for help if you need support or information about quitting drugs.  Tell your health care provider if you often feel depressed.  Tell your health care provider if you have ever been abused or do not feel safe at home. This information is not intended to replace advice given to you by your health care provider. Make sure you discuss any questions you have with your health care provider. Document Released: 03/13/2011 Document Revised: 02/03/2016 Document Reviewed: 06/01/2015  2017 [employer]vier

## 2016-11-14 ENCOUNTER — Ambulatory Visit: Payer: BLUE CROSS/BLUE SHIELD | Admitting: Obstetrics and Gynecology

## 2016-12-26 ENCOUNTER — Ambulatory Visit: Payer: BLUE CROSS/BLUE SHIELD | Admitting: Obstetrics and Gynecology

## 2017-04-18 ENCOUNTER — Encounter: Payer: Self-pay | Admitting: Obstetrics and Gynecology

## 2017-05-18 LAB — FECAL OCCULT BLOOD, IMMUNOCHEMICAL: Fecal Occult Bld: NEGATIVE

## 2017-05-24 ENCOUNTER — Ambulatory Visit
Admission: RE | Admit: 2017-05-24 | Discharge: 2017-05-24 | Disposition: A | Discharge status: Home / Self Care | Payer: BLUE CROSS/BLUE SHIELD | Source: Ambulatory Visit | Attending: Obstetrics and Gynecology | Admitting: Obstetrics and Gynecology

## 2017-05-24 ENCOUNTER — Other Ambulatory Visit: Payer: Self-pay | Admitting: Obstetrics and Gynecology

## 2017-05-24 DIAGNOSIS — R928 Other abnormal and inconclusive findings on diagnostic imaging of breast: Secondary | ICD-10-CM

## 2017-05-24 DIAGNOSIS — Z1239 Encounter for other screening for malignant neoplasm of breast: Secondary | ICD-10-CM

## 2017-05-24 DIAGNOSIS — Z1231 Encounter for screening mammogram for malignant neoplasm of breast: Secondary | ICD-10-CM | POA: Insufficient documentation

## 2017-06-07 ENCOUNTER — Ambulatory Visit
Admission: RE | Admit: 2017-06-07 | Discharge: 2017-06-07 | Disposition: A | Discharge status: Home / Self Care | Payer: BLUE CROSS/BLUE SHIELD | Source: Ambulatory Visit | Attending: Obstetrics and Gynecology | Admitting: Obstetrics and Gynecology

## 2017-06-07 DIAGNOSIS — R928 Other abnormal and inconclusive findings on diagnostic imaging of breast: Secondary | ICD-10-CM

## 2017-06-26 ENCOUNTER — Ambulatory Visit (INDEPENDENT_AMBULATORY_CARE_PROVIDER_SITE_OTHER): Payer: BLUE CROSS/BLUE SHIELD | Admitting: Obstetrics and Gynecology

## 2017-06-26 ENCOUNTER — Encounter: Payer: Self-pay | Admitting: Obstetrics and Gynecology

## 2017-06-26 VITALS — BP 144/79 | HR 74 | Ht 63.0 in | Wt 141.6 lb

## 2017-06-26 DIAGNOSIS — N951 Menopausal and female climacteric states: Secondary | ICD-10-CM | POA: Diagnosis not present

## 2017-06-26 DIAGNOSIS — N939 Abnormal uterine and vaginal bleeding, unspecified: Secondary | ICD-10-CM

## 2017-06-26 NOTE — Progress Notes (Signed)
Chief complaint: 1. Climacteric 2. Abnormal uterine bleeding 3. History of thickened endometrium on ultrasound  Patient presents for follow-up. Previous endometrial biopsy was benign-proliferative endometrium. Prior pelvic ultrasounds demonstrated a slightly thickened, cystic endometrium. Since February 2018 Gail Harper has not had any further bleeding. She is experiencing vasomotor symptoms on a daily basis. She does have hot flashes and night sweats. At this time she is not desiring hormone replacement therapy.  Gail Harper has lost her sister from stroke complications and September. She lost her husband from dementia complications in December 2017. Emotionally she is doing well at this time.  Past medical history, past surgical history, problem list, medications, and allergies are reviewed  OBJECTIVE: BP (!) 144/79   Pulse 74   Ht  (1.6 m)   Wt 141 lb 9.6 oz (64.2 kg)   LMP 10/17/2016 (Exact Date)   BMI 25.08 kg/m  Physical exam-deferred  ASSESSMENT: 1. Climacteric, mildly symptomatic 2. History of benign endometrial biopsy 3. No menses since February 2018  PLAN: 1. Continue monitoring for any abnormal uterine bleeding with menstrual calendar 2. Pros and cons of HRT were reviewed and patient declines treatment at this time 3. Calcium 1200 mg a day and vitamin D 800 international units a day is strongly recommended for prevention of osteoporosis 4. Exercise 30 minutes a day 5 days a week is strongly encouraged 5. Patient is to return in February 2019 for annual exam or sooner if she would like to pursue HRT therapy.  A total of 15 minutes were spent face-to-face with the patient during this encounter and over half of that time dealt with counseling and coordination of care.  Herold Harms, MD  Note: This dictation was prepared with Dragon dictation along with smaller phrase technology. Any transcriptional errors that result from this process are unintentional.

## 2017-06-26 NOTE — Patient Instructions (Signed)
1. Continue monitoring for any abnormal uterine bleeding with menstrual calendar 2. Recommend calcium 1200 mg a day. Tums can satisfy this requirement 3. Recommend vitamin D 800 international units a day. 4. Return as desired if hormone replacement therapy is warranted. 5. Keep annual exam as scheduled in February 2019

## 2017-10-26 NOTE — Progress Notes (Signed)
Patient ID: Gail Harper, female   DOB: [birth year], 52 y.o.   MRN: [id number] ANNUAL PREVENTATIVE CARE GYN  ENCOUNTER NOTE  Subjective:       Gail Harper is a 52 y.o. [phone] female here for a routine annual gynecologic exam.  Current complaints: 1. Left breast nipple- irritated and sore x 1 week- no nipple discharge; no masses palpated by the patient.  Mammogram is due in September through her primary care doctor Dr. Laural Benes 2.  Slight rectal irritation with bowel movements; no rectal bleeding; no chronic problem with hemorrhoids.  Bladder function is normal. Last menstrual period was February 2018; patient is menopausal; vasomotor symptoms are minimal and she is not desiring treatment at this time with medication.   Gynecologic History lmp- 10/17/2016 Contraception: none Last Pap: 10/21/2015 neg/neg. Results were: normal Last mammogram: 06/07/17- birad 1  Obstetric History OB History  Gravida Para Term Preterm AB Living  2 2 2     2   SAB TAB Ectopic Multiple Live Births          2    # Outcome Date GA Lbr Len/2nd Weight Sex Delivery Anes PTL Lv  2 Term 1989   7 lb 1.8 oz (3.225 kg) F Vag-Spont   LIV  1 Term 1985   6 lb 6.4 oz (2.903 kg) F Vag-Spont   LIV      Past Medical History:  Diagnosis Date  . Anemia   . High cholesterol     Past Surgical History:  Procedure Laterality Date  . 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 atorvastatin (LIPITOR) 10 MG tablet TAKE 1 TABLET BY MOUTH EVERY DAY FOR CHOLESTEROL  1  . ferrous sulfate 325 (65 FE) MG tablet Take 325 mg by mouth daily with breakfast.     No current facility-administered medications on file prior to visit.     No Known Allergies  Social History   Socioeconomic History  . Marital status: Widowed    Spouse name: Not on file  . Number of children: Not on file  . Years of education: Not on file  . Highest  education level: Not on file  Social Needs  . Financial resource strain: Not on file  . Food insecurity - worry: Not on file  . Food insecurity - inability: Not on file  . Transportation needs - medical: Not on file  . Transportation needs - non-medical: Not on file  Occupational History  . Not on file  Tobacco Use  . Smoking status: Never Smoker  . Smokeless tobacco: Never Used  Substance and Sexual Activity  . Alcohol use: No  . Drug use: No  . Sexual activity: No  Other Topics Concern  . Not on file  Social History Narrative  . Not on file    Family History  Problem Relation Age of Onset  . Heart disease Sister   . Cancer Neg Hx   . Diabetes Neg Hx   . Breast cancer Neg Hx     The following portions of the patient's history were reviewed and updated as appropriate: allergies, current medications, past family history, past medical history, past social history, past surgical history and problem list.  Review of Systems  Review of Systems  Constitutional:       Mild vasomotor symptoms, not desiring medication  HENT: Negative.   Eyes: Negative.  Respiratory: Negative.   Cardiovascular: Negative.   Gastrointestinal:       Mild rectal irritation bowel movements; no blood  Genitourinary: Negative.   Skin: Positive for itching.       Itching around left nipple  Neurological: Negative.   Endo/Heme/Allergies: Negative.   Psychiatric/Behavioral: Negative.     Objective:   BP 128/79   Pulse 73   Ht 5\' 3"  (1.6 m)   Wt 146 lb 8 oz (66.5 kg)   LMP 10/17/2016   BMI 25.95 kg/m   CONSTITUTIONAL: Well-developed, well-nourished female in no acute distress.  PSYCHIATRIC: Normal mood and affect, slightly anxious. Normal behavior. Normal judgment and thought content. NEUROLGIC: Alert and oriented to person, place, and time. Normal muscle tone coordination. No cranial nerve deficit noted. NECK: Normal range of motion, supple, no masses.  Normal thyroid.  CARDIOVASCULAR:  Normal heart rate noted, regular rhythm, no murmur. RESPIRATORY: Clear to auscultation bilaterally. Effort and breath sounds normal, no problems with respiration noted. BREASTS: Symmetric in size. No masses,nipple drainage, or lymphadenopathy.  No significant skin change, especially focused along the left areola ABDOMEN: Soft, normal bowel sounds, no distention noted.  No tenderness, rebound or guarding.  PELVIC:   External Genitalia: Normal  BUS: Normal  Vagina: Normal; white mucus discharge  Cervix: Normal; no lesions; parous, no cervical motion tenderness  Uterus: Normal; midplane, normal size, shape, mobile, nontender  Adnexa: Normal  RV: External Exam NormaI, No Rectal Masses and Normal Sphincter tone from stool rectal vault LYMPHATIC: No Axillary, Supraclavicular, or Inguinal Adenopathy. MUSCULOSKELETAL: No tenderness or edema    Assessment:   Annual gynecologic examination 52 y.o. Contraception: none [id number] Menopausal, minimally symptomatic Left breast itching; normal exam Perirectal irritation; normal exam    Plan:  Pap: due 2020 Mammogram: thru pcp Stool Guaiac Testing: stool cards Labs: thru pcp Routine preventative health maintenance measures emphasized: Exercise/Diet/Weight control, Tobacco Warnings, Alcohol/Substance use risks, Stress Management and Safe Sex  Skin moisturizer to breasts as needed Consider Anusol HC or Preparation H for perirectal inflammation as needed Continue with Tums as source of calcium  Darol Destinerystal Miller, CMA  Herold HarmsMartin A Kassidy Dockendorf, MD   Note: This dictation was prepared with Dragon dictation along with smaller phrase technology. Any transcriptional errors that result from this process are unintentional.

## 2017-10-30 ENCOUNTER — Encounter: Payer: Self-pay | Admitting: Obstetrics and Gynecology

## 2017-10-30 ENCOUNTER — Ambulatory Visit (INDEPENDENT_AMBULATORY_CARE_PROVIDER_SITE_OTHER): Payer: BLUE CROSS/BLUE SHIELD | Admitting: Obstetrics and Gynecology

## 2017-10-30 VITALS — BP 128/79 | HR 73 | Ht 63.0 in | Wt 146.5 lb

## 2017-10-30 DIAGNOSIS — Z1231 Encounter for screening mammogram for malignant neoplasm of breast: Secondary | ICD-10-CM | POA: Diagnosis not present

## 2017-10-30 DIAGNOSIS — Z01419 Encounter for gynecological examination (general) (routine) without abnormal findings: Secondary | ICD-10-CM

## 2017-10-30 DIAGNOSIS — Z1211 Encounter for screening for malignant neoplasm of colon: Secondary | ICD-10-CM

## 2017-10-30 DIAGNOSIS — K6289 Other specified diseases of anus and rectum: Secondary | ICD-10-CM

## 2017-10-30 DIAGNOSIS — Z780 Asymptomatic menopausal state: Secondary | ICD-10-CM

## 2017-10-30 DIAGNOSIS — Z5320 Procedure and treatment not carried out because of patient's decision for unspecified reasons: Secondary | ICD-10-CM

## 2017-10-30 DIAGNOSIS — Z1239 Encounter for other screening for malignant neoplasm of breast: Secondary | ICD-10-CM

## 2017-10-30 NOTE — Patient Instructions (Signed)
1.  No Pap smear done.  Next Pap is due in 2020 2.  Mammogram is to be obtained through primary care 3.  Stool guaiac cards are given for colon cancer screening 4.  Screening labs are to be obtained through primary care 5.  Continue with healthy eating and exercise 6.  Recommend calcium and vitamin D supplementation 600 mg / 400 international units twice daily 7.  Continue monitoring menstrual bleeding pattern 8.  Return in 1 year for physical   Health Maintenance for Postmenopausal Women Menopause is a normal process in which your reproductive ability comes to an end. This process happens gradually over a span of months to years, usually between the ages of 66 and 39. Menopause is complete when you have missed 12 consecutive menstrual periods. It is important to talk with your health care provider about some of the most common conditions that affect postmenopausal women, such as heart disease, cancer, and bone loss (osteoporosis). Adopting a healthy lifestyle and getting preventive care can help to promote your health and wellness. Those actions can also lower your chances of developing some of these common conditions. What should I know about menopause? During menopause, you may experience a number of symptoms, such as:  Moderate-to-severe hot flashes.  Night sweats.  Decrease in sex drive.  Mood swings.  Headaches.  Tiredness.  Irritability.  Memory problems.  Insomnia.  Choosing to treat or not to treat menopausal changes is an individual decision that you make with your health care provider. What should I know about hormone replacement therapy and supplements? Hormone therapy products are effective for treating symptoms that are associated with menopause, such as hot flashes and night sweats. Hormone replacement carries certain risks, especially as you become older. If you are thinking about using estrogen or estrogen with progestin treatments, discuss the benefits and risks  with your health care provider. What should I know about heart disease and stroke? Heart disease, heart attack, and stroke become more likely as you age. This may be due, in part, to the hormonal changes that your body experiences during menopause. These can affect how your body processes dietary fats, triglycerides, and cholesterol. Heart attack and stroke are both medical emergencies. There are many things that you can do to help prevent heart disease and stroke:  Have your blood pressure checked at least every 1-2 years. High blood pressure causes heart disease and increases the risk of stroke.  If you are 3-68 years old, ask your health care provider if you should take aspirin to prevent a heart attack or a stroke.  Do not use any tobacco products, including cigarettes, chewing tobacco, or electronic cigarettes. If you need help quitting, ask your health care provider.  It is important to eat a healthy diet and maintain a healthy weight. ? Be sure to include plenty of vegetables, fruits, low-fat dairy products, and lean protein. ? Avoid eating foods that are high in solid fats, added sugars, or salt (sodium).  Get regular exercise. This is one of the most important things that you can do for your health. ? Try to exercise for at least 150 minutes each week. The type of exercise that you do should increase your heart rate and make you sweat. This is known as moderate-intensity exercise. ? Try to do strengthening exercises at least twice each week. Do these in addition to the moderate-intensity exercise.  Know your numbers.Ask your health care provider to check your cholesterol and your blood glucose. Continue to  to have your blood tested as directed by your health care provider.  What should I know about cancer screening? There are several types of cancer. Take the following steps to reduce your risk and to catch any cancer development as early as possible. Breast Cancer  Practice breast  self-awareness. ? This means understanding how your breasts normally appear and feel. ? It also means doing regular breast self-exams. Let your health care provider know about any changes, no matter how small.  If you are 40 or older, have a clinician do a breast exam (clinical breast exam or CBE) every year. Depending on your age, family history, and medical history, it may be recommended that you also have a yearly breast X-ray (mammogram).  If you have a family history of breast cancer, talk with your health care provider about genetic screening.  If you are at high risk for breast cancer, talk with your health care provider about having an MRI and a mammogram every year.  Breast cancer (BRCA) gene test is recommended for women who have family members with BRCA-related cancers. Results of the assessment will determine the need for genetic counseling and BRCA1 and for BRCA2 testing. BRCA-related cancers include these types: ? Breast. This occurs in males or females. ? Ovarian. ? Tubal. This may also be called fallopian tube cancer. ? Cancer of the abdominal or pelvic lining (peritoneal cancer). ? Prostate. ? Pancreatic.  Cervical, Uterine, and Ovarian Cancer Your health care provider may recommend that you be screened regularly for cancer of the pelvic organs. These include your ovaries, uterus, and vagina. This screening involves a pelvic exam, which includes checking for microscopic changes to the surface of your cervix (Pap test).  For women ages 21-65, health care providers may recommend a pelvic exam and a Pap test every three years. For women ages 30-65, they may recommend the Pap test and pelvic exam, combined with testing for human papilloma virus (HPV), every five years. Some types of HPV increase your risk of cervical cancer. Testing for HPV may also be done on women of any age who have unclear Pap test results.  Other health care providers may not recommend any screening for  nonpregnant women who are considered low risk for pelvic cancer and have no symptoms. Ask your health care provider if a screening pelvic exam is right for you.  If you have had past treatment for cervical cancer or a condition that could lead to cancer, you need Pap tests and screening for cancer for at least 20 years after your treatment. If Pap tests have been discontinued for you, your risk factors (such as having a new sexual partner) need to be reassessed to determine if you should start having screenings again. Some women have medical problems that increase the chance of getting cervical cancer. In these cases, your health care provider may recommend that you have screening and Pap tests more often.  If you have a family history of uterine cancer or ovarian cancer, talk with your health care provider about genetic screening.  If you have vaginal bleeding after reaching menopause, tell your health care provider.  There are currently no reliable tests available to screen for ovarian cancer.  Lung Cancer Lung cancer screening is recommended for adults 55-80 years old who are at high risk for lung cancer because of a history of smoking. A yearly low-dose CT scan of the lungs is recommended if you:  Currently smoke.  Have a history of at least 30   pack-years of smoking and you currently smoke or have quit within the past 15 years. A pack-year is smoking an average of one pack of cigarettes per day for one year.  Yearly screening should:  Continue until it has been 15 years since you quit.  Stop if you develop a health problem that would prevent you from having lung cancer treatment.  Colorectal Cancer  This type of cancer can be detected and can often be prevented.  Routine colorectal cancer screening usually begins at age 50 and continues through age 75.  If you have risk factors for colon cancer, your health care provider may recommend that you be screened at an earlier age.  If you  have a family history of colorectal cancer, talk with your health care provider about genetic screening.  Your health care provider may also recommend using home test kits to check for hidden blood in your stool.  A small camera at the end of a tube can be used to examine your colon directly (sigmoidoscopy or colonoscopy). This is done to check for the earliest forms of colorectal cancer.  Direct examination of the colon should be repeated every 5-10 years until age 75. However, if early forms of precancerous polyps or small growths are found or if you have a family history or genetic risk for colorectal cancer, you may need to be screened more often.  Skin Cancer  Check your skin from head to toe regularly.  Monitor any moles. Be sure to tell your health care provider: ? About any new moles or changes in moles, especially if there is a change in a mole's shape or color. ? If you have a mole that is larger than the size of a pencil eraser.  If any of your family members has a history of skin cancer, especially at a young age, talk with your health care provider about genetic screening.  Always use sunscreen. Apply sunscreen liberally and repeatedly throughout the day.  Whenever you are outside, protect yourself by wearing long sleeves, pants, a wide-brimmed hat, and sunglasses.  What should I know about osteoporosis? Osteoporosis is a condition in which bone destruction happens more quickly than new bone creation. After menopause, you may be at an increased risk for osteoporosis. To help prevent osteoporosis or the bone fractures that can happen because of osteoporosis, the following is recommended:  If you are 19-50 years old, get at least 1,000 mg of calcium and at least 600 mg of vitamin D per day.  If you are older than age 50 but younger than age 70, get at least 1,200 mg of calcium and at least 600 mg of vitamin D per day.  If you are older than age 70, get at least 1,200 mg of  calcium and at least 800 mg of vitamin D per day.  Smoking and excessive alcohol intake increase the risk of osteoporosis. Eat foods that are rich in calcium and vitamin D, and do weight-bearing exercises several times each week as directed by your health care provider. What should I know about how menopause affects my mental health? Depression may occur at any age, but it is more common as you become older. Common symptoms of depression include:  Low or sad mood.  Changes in sleep patterns.  Changes in appetite or eating patterns.  Feeling an overall lack of motivation or enjoyment of activities that you previously enjoyed.  Frequent crying spells.  Talk with your health care provider if you think that   are experiencing depression. What should I know about immunizations? It is important that you get and maintain your immunizations. These include:  Tetanus, diphtheria, and pertussis (Tdap) booster vaccine.  Influenza every year before the flu season begins.  Pneumonia vaccine.  Shingles vaccine.  Your health care provider may also recommend other immunizations. This information is not intended to replace advice given to you by your health care provider. Make sure you discuss any questions you have with your health care provider. Document Released: 10/20/2005 Document Revised: 03/17/2016 Document Reviewed: 06/01/2015 [employer] Interactive Patient Education  2018 [employer].

## 2017-12-26 LAB — FECAL OCCULT BLOOD, IMMUNOCHEMICAL: Fecal Occult Bld: NEGATIVE

## 2018-04-04 ENCOUNTER — Encounter: Payer: Self-pay | Admitting: Obstetrics and Gynecology

## 2018-04-05 ENCOUNTER — Other Ambulatory Visit: Payer: Self-pay | Admitting: Internal Medicine

## 2018-04-05 DIAGNOSIS — Z1231 Encounter for screening mammogram for malignant neoplasm of breast: Secondary | ICD-10-CM

## 2018-05-27 ENCOUNTER — Encounter: Payer: Self-pay | Admitting: Radiology

## 2018-05-27 ENCOUNTER — Ambulatory Visit
Admission: RE | Admit: 2018-05-27 | Discharge: 2018-05-27 | Disposition: A | Discharge status: Home / Self Care | Payer: BLUE CROSS/BLUE SHIELD | Source: Ambulatory Visit | Attending: Internal Medicine | Admitting: Internal Medicine

## 2018-05-27 DIAGNOSIS — Z1231 Encounter for screening mammogram for malignant neoplasm of breast: Secondary | ICD-10-CM | POA: Diagnosis present

## 2018-11-05 ENCOUNTER — Encounter: Payer: BLUE CROSS/BLUE SHIELD | Admitting: Obstetrics and Gynecology

## 2018-11-14 ENCOUNTER — Encounter: Payer: BLUE CROSS/BLUE SHIELD | Admitting: Obstetrics and Gynecology

## 2018-11-18 NOTE — Patient Instructions (Addendum)
Health Maintenance for Postmenopausal Women Menopause is a normal process in which your reproductive ability comes to an end. This process happens gradually over a span of months to years, usually between the ages of 62 and 89. Menopause is complete when you have missed 12 consecutive menstrual periods. It is important to talk with your health care provider about some of the most common conditions that affect postmenopausal women, such as heart disease, cancer, and bone loss (osteoporosis). Adopting a healthy lifestyle and getting preventive care can help to promote your health and wellness. Those actions can also lower your chances of developing some of these common conditions. What should I know about menopause? During menopause, you may experience a number of symptoms, such as:  Moderate-to-severe hot flashes.  Night sweats.  Decrease in sex drive.  Mood swings.  Headaches.  Tiredness.  Irritability.  Memory problems.  Insomnia. Choosing to treat or not to treat menopausal changes is an individual decision that you make with your health care provider. What should I know about hormone replacement therapy and supplements? Hormone therapy products are effective for treating symptoms that are associated with menopause, such as hot flashes and night sweats. Hormone replacement carries certain risks, especially as you become older. If you are thinking about using estrogen or estrogen with progestin treatments, discuss the benefits and risks with your health care provider. What should I know about heart disease and stroke? Heart disease, heart attack, and stroke become more likely as you age. This may be due, in part, to the hormonal changes that your body experiences during menopause. These can affect how your body processes dietary fats, triglycerides, and cholesterol. Heart attack and stroke are both medical emergencies. There are many things that you can do to help prevent heart disease  and stroke:  Have your blood pressure checked at least every 1-2 years. High blood pressure causes heart disease and increases the risk of stroke.  If you are 79-72 years old, ask your health care provider if you should take aspirin to prevent a heart attack or a stroke.  Do not use any tobacco products, including cigarettes, chewing tobacco, or electronic cigarettes. If you need help quitting, ask your health care provider.  It is important to eat a healthy diet and maintain a healthy weight. ? Be sure to include plenty of vegetables, fruits, low-fat dairy products, and lean protein. ? Avoid eating foods that are high in solid fats, added sugars, or salt (sodium).  Get regular exercise. This is one of the most important things that you can do for your health. ? Try to exercise for at least 150 minutes each week. The type of exercise that you do should increase your heart rate and make you sweat. This is known as moderate-intensity exercise. ? Try to do strengthening exercises at least twice each week. Do these in addition to the moderate-intensity exercise.  Know your numbers.Ask your health care provider to check your cholesterol and your blood glucose. Continue to have your blood tested as directed by your health care provider.  What should I know about cancer screening? There are several types of cancer. Take the following steps to reduce your risk and to catch any cancer development as early as possible. Breast Cancer  Practice breast self-awareness. ? This means understanding how your breasts normally appear and feel. ? It also means doing regular breast self-exams. Let your health care provider know about any changes, no matter how small.  If you are 40 or  older, have a clinician do a breast exam (clinical breast exam or CBE) every year. Depending on your age, family history, and medical history, it may be recommended that you also have a yearly breast X-ray (mammogram).  If you  have a family history of breast cancer, talk with your health care provider about genetic screening.  If you are at high risk for breast cancer, talk with your health care provider about having an MRI and a mammogram every year.  Breast cancer (BRCA) gene test is recommended for women who have family members with BRCA-related cancers. Results of the assessment will determine the need for genetic counseling and BRCA1 and for BRCA2 testing. BRCA-related cancers include these types: ? Breast. This occurs in males or females. ? Ovarian. ? Tubal. This may also be called fallopian tube cancer. ? Cancer of the abdominal or pelvic lining (peritoneal cancer). ? Prostate. ? Pancreatic. Cervical, Uterine, and Ovarian Cancer Your health care provider may recommend that you be screened regularly for cancer of the pelvic organs. These include your ovaries, uterus, and vagina. This screening involves a pelvic exam, which includes checking for microscopic changes to the surface of your cervix (Pap test).  For women ages 21-65, health care providers may recommend a pelvic exam and a Pap test every three years. For women ages 39-65, they may recommend the Pap test and pelvic exam, combined with testing for human papilloma virus (HPV), every five years. Some types of HPV increase your risk of cervical cancer. Testing for HPV may also be done on women of any age who have unclear Pap test results.  Other health care providers may not recommend any screening for nonpregnant women who are considered low risk for pelvic cancer and have no symptoms. Ask your health care provider if a screening pelvic exam is right for you.  If you have had past treatment for cervical cancer or a condition that could lead to cancer, you need Pap tests and screening for cancer for at least 20 years after your treatment. If Pap tests have been discontinued for you, your risk factors (such as having a new sexual partner) need to be reassessed  to determine if you should start having screenings again. Some women have medical problems that increase the chance of getting cervical cancer. In these cases, your health care provider may recommend that you have screening and Pap tests more often.  If you have a family history of uterine cancer or ovarian cancer, talk with your health care provider about genetic screening.  If you have vaginal bleeding after reaching menopause, tell your health care provider.  There are currently no reliable tests available to screen for ovarian cancer. Lung Cancer Lung cancer screening is recommended for adults 57-50 years old who are at high risk for lung cancer because of a history of smoking. A yearly low-dose CT scan of the lungs is recommended if you:  Currently smoke.  Have a history of at least 30 pack-years of smoking and you currently smoke or have quit within the past 15 years. A pack-year is smoking an average of one pack of cigarettes per day for one year. Yearly screening should:  Continue until it has been 15 years since you quit.  Stop if you develop a health problem that would prevent you from having lung cancer treatment. Colorectal Cancer  This type of cancer can be detected and can often be prevented.  Routine colorectal cancer screening usually begins at age 12 and continues through  age 75.  If you have risk factors for colon cancer, your health care provider may recommend that you be screened at an earlier age.  If you have a family history of colorectal cancer, talk with your health care provider about genetic screening.  Your health care provider may also recommend using home test kits to check for hidden blood in your stool.  A small camera at the end of a tube can be used to examine your colon directly (sigmoidoscopy or colonoscopy). This is done to check for the earliest forms of colorectal cancer.  Direct examination of the colon should be repeated every 5-10 years until  age 75. However, if early forms of precancerous polyps or small growths are found or if you have a family history or genetic risk for colorectal cancer, you may need to be screened more often. Skin Cancer  Check your skin from head to toe regularly.  Monitor any moles. Be sure to tell your health care provider: ? About any new moles or changes in moles, especially if there is a change in a mole's shape or color. ? If you have a mole that is larger than the size of a pencil eraser.  If any of your family members has a history of skin cancer, especially at a young age, talk with your health care provider about genetic screening.  Always use sunscreen. Apply sunscreen liberally and repeatedly throughout the day.  Whenever you are outside, protect yourself by wearing long sleeves, pants, a wide-brimmed hat, and sunglasses. What should I know about osteoporosis? Osteoporosis is a condition in which bone destruction happens more quickly than new bone creation. After menopause, you may be at an increased risk for osteoporosis. To help prevent osteoporosis or the bone fractures that can happen because of osteoporosis, the following is recommended:  If you are 19-50 years old, get at least 1,000 mg of calcium and at least 600 mg of vitamin D per day.  If you are older than age 50 but younger than age 70, get at least 1,200 mg of calcium and at least 600 mg of vitamin D per day.  If you are older than age 70, get at least 1,200 mg of calcium and at least 800 mg of vitamin D per day. Smoking and excessive alcohol intake increase the risk of osteoporosis. Eat foods that are rich in calcium and vitamin D, and do weight-bearing exercises several times each week as directed by your health care provider. What should I know about how menopause affects my mental health? Depression may occur at any age, but it is more common as you become older. Common symptoms of depression include:  Low or sad  mood.  Changes in sleep patterns.  Changes in appetite or eating patterns.  Feeling an overall lack of motivation or enjoyment of activities that you previously enjoyed.  Frequent crying spells. Talk with your health care provider if you think that you are experiencing depression. What should I know about immunizations? It is important that you get and maintain your immunizations. These include:  Tetanus, diphtheria, and pertussis (Tdap) booster vaccine.  Influenza every year before the flu season begins.  Pneumonia vaccine.  Shingles vaccine. Your health care provider may also recommend other immunizations. This information is not intended to replace advice given to you by your health care provider. Make sure you discuss any questions you have with your health care provider. Document Released: 10/20/2005 Document Revised: 03/17/2016 Document Reviewed: 06/01/2015 Elsevier Interactive Patient Education    2019 [place] Breast self-awareness means:  Knowing how your breasts look.  Knowing how your breasts feel.  Checking your breasts every month for changes.  Telling your doctor if you notice a change in your breasts. Breast self-awareness allows you to notice a breast problem early while it is still small. How to do a breast self-exam One way to learn what is normal for your breasts and to check for changes is to do a breast self-exam. To do a breast self-exam: Look for Changes  1. Take off all the clothes above your waist. 2. Stand in front of a mirror in a room with good lighting. 3. Put your hands on your hips. 4. Push your hands down. 5. Look at your breasts and nipples in the mirror to see if one breast or nipple looks different than the other. Check to see if: ? The shape of one breast is different. ? The size of one breast is different. ? There are wrinkles, dips, and bumps in one breast and not the other. 6. Look at each breast for  changes in your skin, such as: ? Redness. ? Scaly areas. 7. Look for changes in your nipples, such as: ? Liquid around the nipples. ? Bleeding. ? Dimpling. ? Redness. ? A change in where the nipples are. Feel for Changes 1. Lie on your back on the floor. 2. Feel each breast. To do this, follow these steps: ? Pick a breast to feel. ? Put the arm closest to that breast above your head. ? Use your other arm to feel the nipple area of your breast. Feel the area with the pads of your three middle fingers by making small circles with your fingers. For the first circle, press lightly. For the second circle, press harder. For the third circle, press even harder. ? Keep making circles with your fingers at the light, harder, and even harder pressures as you move down your breast. Stop when you feel your ribs. ? Move your fingers a little toward the center of your body. ? Start making circles with your fingers again, this time going up until you reach your collarbone. ? Keep making up and down circles until you reach your armpit. Remember to keep using the three pressures. ? Feel the other breast in the same way. 3. Sit or stand in the shower or tub. 4. With soapy water on your skin, feel each breast the same way you did in step 2, when you were lying on the floor. Write Down What You Find After doing the self-exam, write down:  What is normal for each breast.  Any changes you find in each breast.  When you last had your period.  How often should I check my breasts? Check your breasts every month. If you are breastfeeding, the best time to check them is after you feed your baby or after you use a breast pump. If you get periods, the best time to check your breasts is 5-7 days after your period is over. When should I see my doctor? See your doctor if you notice:  A change in shape or size of your breasts or nipples.  A change in the skin of your breast or nipples, such as red or scaly  skin.  Unusual fluid coming from your nipples.  A lump or thick area that was not there before.  Pain in your breasts.  Anything that concerns you. This information is not intended to replace advice  given to you by your health care provider. Make sure you discuss any questions you have with your health care provider. Document Released: 02/14/2008 Document Revised: 02/03/2016 Document Reviewed: 07/18/2015 Elsevier Interactive Patient Education  2019 [employer].

## 2018-11-19 ENCOUNTER — Ambulatory Visit (INDEPENDENT_AMBULATORY_CARE_PROVIDER_SITE_OTHER): Payer: BLUE CROSS/BLUE SHIELD | Admitting: Obstetrics and Gynecology

## 2018-11-19 ENCOUNTER — Encounter: Payer: Self-pay | Admitting: Obstetrics and Gynecology

## 2018-11-19 ENCOUNTER — Other Ambulatory Visit (HOSPITAL_COMMUNITY)
Admission: RE | Admit: 2018-11-19 | Discharge: 2018-11-19 | Disposition: A | Discharge status: Home / Self Care | Payer: BLUE CROSS/BLUE SHIELD | Source: Ambulatory Visit | Attending: Obstetrics and Gynecology | Admitting: Obstetrics and Gynecology

## 2018-11-19 VITALS — BP 137/77 | HR 78 | Ht 63.0 in | Wt 149.0 lb

## 2018-11-19 DIAGNOSIS — E663 Overweight: Secondary | ICD-10-CM

## 2018-11-19 DIAGNOSIS — Z1239 Encounter for other screening for malignant neoplasm of breast: Secondary | ICD-10-CM | POA: Diagnosis not present

## 2018-11-19 DIAGNOSIS — Z7689 Persons encountering health services in other specified circumstances: Secondary | ICD-10-CM

## 2018-11-19 DIAGNOSIS — Z124 Encounter for screening for malignant neoplasm of cervix: Secondary | ICD-10-CM

## 2018-11-19 DIAGNOSIS — E785 Hyperlipidemia, unspecified: Secondary | ICD-10-CM | POA: Diagnosis not present

## 2018-11-19 DIAGNOSIS — Z01419 Encounter for gynecological examination (general) (routine) without abnormal findings: Secondary | ICD-10-CM

## 2018-11-19 DIAGNOSIS — N951 Menopausal and female climacteric states: Secondary | ICD-10-CM

## 2018-11-19 NOTE — Progress Notes (Signed)
PT is present today for her annual exam. Pt stated that she has been doing self-breast exams monthly. Pt stated that she is doing well and denies any issues. No problems or concerns.     

## 2018-11-19 NOTE — Progress Notes (Signed)
ANNUAL PREVENTATIVE CARE GYNECOLOGY  ENCOUNTER NOTE  Subjective:       Gail Harper is a 53 y.o. [phone] menopausal female (x 2 years) here for a routine annual gynecologic exam. The patient is not sexually active (widow). The patient is not taking hormone replacement therapy. Patient denies post-menopausal vaginal bleeding. The patient wears seatbelts: yes (walking 5 x weekly). The patient participates in regular exercise: yes. Has the patient ever been transfused or tattooed?: no. The patient reports that there is not domestic violence in her life.  Current complaints: 1.  Notes hot flushes 1-3 times per day, most days out of the month. Lasts for a few seconds then subsides.  States that it is manageable. Declines medical management.    Gynecologic History Patient's last menstrual period was 10/17/2016. Contraception: post menopausal status Last Pap: 10/21/2015. Results were: normal Last mammogram: 05/27/2018. Results were: normal Last Colonoscopy: 4 years ago.  Was normal per patient except 2 benign polyps. Recommended q 10 year follow up.  Last Dexa Scan: never had one PCP: Dr. Laural Benes at [facility] (however will be looking for a new PCP due to insurance changes)   Obstetric History OB History  Gravida Para Term Preterm AB Living  2 2 2     2   SAB TAB Ectopic Multiple Live Births          2    # Outcome Date GA Lbr Len/2nd Weight Sex Delivery Anes PTL Lv  2 Term 1989   7 lb 1.8 oz (3.225 kg) F Vag-Spont   LIV  1 Term 1985   6 lb 6.4 oz (2.903 kg) F Vag-Spont   LIV    Past Medical History:  Diagnosis Date  . Anemia   . High cholesterol     Family History  Problem Relation Age of Onset  . Heart disease Sister   . Cancer Neg Hx   . Diabetes Neg Hx   . Breast cancer Neg Hx     Past Surgical History:  Procedure Laterality Date  . 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 Marital status: Widowed    Spouse name: Not  on file  . Number of children: Not on file  . Years of education: Not on file  . Highest education level: Not on file  Occupational History  . Not on file  Social Needs  . Financial resource strain: Not on file  . Food insecurity:    Worry: Not on file    Inability: Not on file  . Transportation needs:    Medical: Not on file    Non-medical: Not on file  Tobacco Use  . Smoking status: Never Smoker  . Smokeless tobacco: Never Used  Substance and Sexual Activity  . Alcohol use: No  . Drug use: No  . Sexual activity: Never  Lifestyle  . Physical activity:    Days per week: 1 day    Minutes per session: 30 min  . Stress: Not on file  Relationships  . Social connections:    Talks on phone: Not on file    Gets together: Not on file    Attends religious service: Not on file    Active member of club or organization: Not on file    Attends meetings of clubs or organizations: Not on file    Relationship status: Not on file  . Intimate partner violence:    Fear  of current or ex partner: Not on file    Emotionally abused: Not on file    Physically abused: Not on file    Forced sexual activity: Not on file  Other Topics Concern  . Not on file  Social History Narrative  . Not on file    Current Outpatient Medications on File Prior to Visit  Medication Sig Dispense Refill  . atorvastatin (LIPITOR) 10 MG tablet TAKE 1 TABLET BY MOUTH EVERY DAY FOR CHOLESTEROL  1  . ferrous sulfate 325 (65 FE) MG tablet Take 325 mg by mouth daily with breakfast.    . Meloxicam 5 MG CAPS Take by mouth.     No current facility-administered medications on file prior to visit.     No Known Allergies    Review of Systems ROS Review of Systems - General ROS: negative for - chills, fatigue, fever, night sweats, weight gain or weight loss.  Positive for hot flushes (see HPI) Psychological ROS: negative for - anxiety, decreased libido, depression, mood swings, physical abuse or sexual  abuse Ophthalmic ROS: negative for - blurry vision, eye pain or loss of vision ENT ROS: negative for - headaches, hearing change, visual changes or vocal changes Allergy and Immunology ROS: negative for - hives, itchy/watery eyes or seasonal allergies Hematological and Lymphatic ROS: negative for - bleeding problems, bruising, swollen lymph nodes or weight loss Endocrine ROS: negative for - galactorrhea, hair pattern changes, hot flashes, malaise/lethargy, mood swings, palpitations, polydipsia/polyuria, skin changes, temperature intolerance or unexpected weight changes Breast ROS: negative for - new or changing breast lumps or nipple discharge Respiratory ROS: negative for - cough or shortness of breath Cardiovascular ROS: negative for - chest pain, irregular heartbeat, palpitations or shortness of breath Gastrointestinal ROS: no abdominal pain, change in bowel habits, or black or bloody stools Genito-Urinary ROS: no dysuria, trouble voiding, or hematuria Musculoskeletal ROS: negative for - joint pain or joint stiffness Neurological ROS: negative for - bowel and bladder control changes Dermatological ROS: negative for rash and skin lesion changes   Objective:   BP 137/77   Pulse 78   Ht  (1.6 m)   Wt 149 lb (67.6 kg)   LMP 10/17/2016   BMI 26.39 kg/m  CONSTITUTIONAL: Well-developed, well-nourished female in no acute distress. Overweight PSYCHIATRIC: Normal mood and affect. Normal behavior. Normal judgment and thought content. NEUROLGIC: Alert and oriented to person, place, and time. Normal muscle tone coordination. No cranial nerve deficit noted. HENT:  Normocephalic, atraumatic, External right and left ear normal. Oropharynx is clear and moist EYES: Conjunctivae and EOM are normal. Pupils are equal, round, and reactive to light. No scleral icterus.  NECK: Normal range of motion, supple, no masses.  Normal thyroid.  SKIN: Skin is warm and dry. No rash noted. Not diaphoretic. No  erythema. No pallor. CARDIOVASCULAR: Normal heart rate noted, regular rhythm, no murmur. RESPIRATORY: Clear to auscultation bilaterally. Effort and breath sounds normal, no problems with respiration noted. BREASTS: Symmetric in size. No masses, skin changes, nipple drainage, or lymphadenopathy. ABDOMEN: Soft, normal bowel sounds, no distention noted.  No tenderness, rebound or guarding.  BLADDER: Normal PELVIC:  Bladder no bladder distension noted  Urethra: normal appearing urethra with no masses, tenderness or lesions  Vulva: normal appearing vulva with no masses, tenderness or lesions  Vagina: normal appearing vagina with normal color and discharge, no lesions  Cervix: normal appearing cervix without discharge or lesions  Uterus: uterus is normal size, shape, consistency and nontender  Adnexa:  normal adnexa in size, nontender and no masses  RV: External Exam NormaI, No Rectal Masses and Normal Sphincter tone  MUSCULOSKELETAL: Normal range of motion. No tenderness.  No cyanosis, clubbing, or edema.  2+ distal pulses. LYMPHATIC: No Axillary, Supraclavicular, or Inguinal Adenopathy.   Labs: No results found for: WBC, HGB, HCT, MCV, PLT  No results found for: CREATININE, BUN, NA, K, CL, CO2  No results found for: ALT, AST, GGT, ALKPHOS, BILITOT  No results found for: CHOL, HDL, LDLCALC, LDLDIRECT, TRIG, CHOLHDL  No results found for: TSH  No results found for: HGBA1C   Assessment:   Annual gynecologic examination 53 y.o. Overweight Hyperlipidemia Encounter to establish care with new doctor Menopause vasomotor symptoms  Plan:  Pap: Pap Co Test Mammogram: Ordered Stool Guaiac Testing:  Not Indicated. Colonoscopy up to date.  Labs: Lipid panel ordered. All other labs up to date (due in July/August).  Contraception: post menopausal status Routine preventative health maintenance measures emphasized: Exercise/Diet/Weight control, Tobacco Warnings, Alcohol/Substance use risks  and Stress Management Declines flu vaccine.  Menopausal vasomotor symptoms, patient declines medical management at this time, notes symptoms are manageable.  Return to Clinic - 1 Year   Hildred Laser, MD [facility] Care

## 2018-11-20 LAB — LIPID PANEL
Chol/HDL Ratio: 3.2 ratio (ref 0.0–4.4)
Cholesterol, Total: 149 mg/dL (ref 100–199)
HDL: 46 mg/dL (ref >39)
LDL Calculated: 76 mg/dL (ref 0–99)
Triglycerides: 136 mg/dL (ref 0–149)
VLDL Cholesterol Cal: 27 mg/dL (ref 5–40)

## 2018-11-22 LAB — CYTOLOGY - PAP
Diagnosis: NEGATIVE
HPV: NOT DETECTED

## 2019-03-13 ENCOUNTER — Ambulatory Visit: Payer: BLUE CROSS/BLUE SHIELD | Admitting: Family Medicine

## 2019-03-13 ENCOUNTER — Other Ambulatory Visit: Payer: Self-pay

## 2019-03-13 ENCOUNTER — Encounter: Payer: Self-pay | Admitting: Family Medicine

## 2019-03-13 VITALS — BP 128/70 | HR 94 | Temp 97.1°F | Resp 16 | Ht 63.0 in | Wt 150.9 lb

## 2019-03-13 DIAGNOSIS — Z1159 Encounter for screening for other viral diseases: Secondary | ICD-10-CM

## 2019-03-13 DIAGNOSIS — G894 Chronic pain syndrome: Secondary | ICD-10-CM

## 2019-03-13 DIAGNOSIS — B351 Tinea unguium: Secondary | ICD-10-CM

## 2019-03-13 DIAGNOSIS — E782 Mixed hyperlipidemia: Secondary | ICD-10-CM

## 2019-03-13 DIAGNOSIS — Z8271 Family history of polycystic kidney: Secondary | ICD-10-CM

## 2019-03-13 DIAGNOSIS — I10 Essential (primary) hypertension: Secondary | ICD-10-CM

## 2019-03-13 DIAGNOSIS — Z114 Encounter for screening for human immunodeficiency virus [HIV]: Secondary | ICD-10-CM

## 2019-03-13 DIAGNOSIS — B353 Tinea pedis: Secondary | ICD-10-CM

## 2019-03-13 DIAGNOSIS — D509 Iron deficiency anemia, unspecified: Secondary | ICD-10-CM

## 2019-03-13 MED ORDER — TERBINAFINE HCL 1 % EX CREA: 1.0000 "application " | TOPICAL_CREAM | Freq: Two times a day (BID) | CUTANEOUS | 0 refills | Status: DC

## 2019-03-13 MED ORDER — CICLOPIROX 8 % EX SOLN: Freq: Every day | CUTANEOUS | 0 refills | Status: DC

## 2019-03-13 NOTE — Progress Notes (Signed)
Name: Gail DecampLydia Ann Harper   MRN: [id number]    DOB: 03/22/1966   Date:03/13/2019       Progress Note  Subjective  Chief Complaint  Chief Complaint  Patient presents with  . Establish Care  . Rash    on arms and skin on feet dry, itch  . Hyperlipidemia    HPI  Pt presents to establish care - had to [facility] due to [facility] changes.   Rash: She notes intermittent rash on the LEFT forearm when exposed to heat (she drives with her arm sitting outside of the window.   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  She also has dry scaling skin of the feet.   HLD: Taking 10mg  lipitor - no chest pain, shortness of breath, or myalgias.  Last labs in [facility] 2020 and LDL was 76.  Not due for repeat today.  Chronic Pain: Taking meloxicam PRN.  Pain is usually in the LEFT upper chest or shoulder area (pulled a muscle several years ago).  Also gets some pain in the right shoulder, but this has been fairly well controlled in recent years.   HTN: Family history of HTN; she was on low dose of HCTZ at one point, but was taken off because her BP was well controlled.  She eats a low salt diet - does not cook with salt or add it to her food at the table.  No headaches, BLE edema, chest pain, shortness of breath.   Family history polycystic kidney disease: She has 3 siblings and her mother with PCKD.  Her kidney function has been normal over the years, no hematuria, no issues with polyuria or decreased urine output.   IDA: She is taking iron supplement, has been stable.    Patient Active Problem List   Diagnosis Date Noted  . Menopause present, declines hormone replacement therapy 10/30/2017  . Ovarian cyst, left 11/04/2015  . Abnormal uterine bleeding (AUB) 11/04/2015  . Chronic pain associated with significant psychosocial dysfunction 09/09/2014  . HLD (hyperlipidemia) 09/09/2014  . BP (high blood pressure)  09/09/2014    Past Surgical History:  Procedure Laterality Date  . 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    Family History  Problem Relation Age of Onset  . Heart disease Sister   . Kidney disease Sister   . Stroke Mother   . Kidney disease Mother   . Cancer Father   . Kidney disease Brother   . Diabetes Neg Hx   . Breast cancer Neg Hx     Social History   Socioeconomic History  . Marital status: Widowed    Spouse name: Not on file  . Number of children: 2  . Years of education: Not on file  . Highest education level: Not on file  Occupational History  . Not on file  Social Needs  . Financial resource strain: Not hard at all  . Food insecurity    Worry: Never true    Inability: Not on file  . Transportation needs    Medical: No    Non-medical: No  Tobacco Use  . Smoking status: Never Smoker  . Smokeless tobacco: Never Used  Substance and Sexual Activity  . Alcohol use: No  . Drug use: No  . Sexual activity: Not Currently    Partners: Male  Lifestyle  . Physical activity    Days  per week: 2 days    Minutes per session: 30 min  . Stress: Not at all  Relationships  . Social connections    Talks on phone: More than three times a week    Gets together: More than three times a week    Attends religious service: More than 4 times per year    Active member of club or organization: Yes    Attends meetings of clubs or organizations: More than 4 times per year    Relationship status: Widowed  . Intimate partner violence    Fear of current or ex partner: No    Emotionally abused: No    Physically abused: No    Forced sexual activity: No  Other Topics Concern  . Not on file  Social History Narrative  . Not on file    Current Outpatient Medications:  .  atorvastatin (LIPITOR) 10 MG tablet, TAKE 1 TABLET BY MOUTH EVERY DAY FOR CHOLESTEROL, Disp: , Rfl: 1 .  ferrous sulfate 325 (65 FE) MG tablet, Take 325 mg by mouth daily with breakfast., Disp: , Rfl:  .   Meloxicam 5 MG CAPS, Take by mouth., Disp: , Rfl:   No Known Allergies  I personally reviewed active problem list, medication list, allergies, notes from last encounter, lab results with the patient/caregiver today.   ROS Ten systems reviewed and is negative except as mentioned in HPI  Objective  Vitals:   03/13/19 0800  BP: 128/70  Pulse: 94  Resp: 16  Temp: (!) 97.1 F (36.2 C)  TempSrc: Oral  SpO2: 97%  Weight: 150 lb 14.4 oz (68.4 kg)  Height: 5\' 3"  (1.6 m)   Body mass index is 26.73 kg/m.  Physical Exam  Constitutional: Patient appears well-developed and well-nourished. No distress.  HENT: Head: Normocephalic and atraumatic. Eyes: Conjunctivae and EOM are normal. No scleral icterus.  Neck: Normal range of motion. Neck supple. No JVD present. No thyromegaly present.  Cardiovascular: Normal rate, regular rhythm and normal heart sounds.  No murmur heard. No BLE edema. Pulmonary/Chest: Effort normal and breath sounds normal. No respiratory distress. Musculoskeletal: Normal range of motion, no joint effusions. No gross deformities Neurological: Pt is alert and oriented to person, place, and time. No cranial nerve deficit. Coordination, balance, strength, speech and gait are normal.  Skin: Skin is warm and dry. No rash noted. No erythema.  Psychiatric: Patient has a normal mood and affect. behavior is normal. Judgment and thought content normal.  No results found for this or any previous visit (from the past 72 hour(s)).  PHQ2/9: Depression screen PHQ 2/9 03/13/2019  Decreased Interest 0  Down, Depressed, Hopeless 0  PHQ - 2 Score 0  Altered sleeping 1  Tired, decreased energy 0  Change in appetite 0  Feeling bad or failure about yourself  0  Trouble concentrating 0  Moving slowly or fidgety/restless 0  Suicidal thoughts 0  PHQ-9 Score 1  Difficult doing work/chores Not difficult at all   PHQ-2/9 Result is negative.    Fall Risk: Fall Risk  03/13/2019  Falls in  the past year? 0  Number falls in past yr: 0  Injury with Fall? 0  Follow up Falls evaluation completed    Assessment & Plan  1. Family history of adult polycystic kidney disease - Discussed genetics referral, watching and waiting with routine labs, and US renal - she would like to proceed with US if covered as she has 3 siblings and her mother with PCKD.  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   - US Renal; Future  2. Mixed hyperlipidemia - COMPLETE METABOLIC PANEL WITH GFR  3. Essential hypertension - COMPLETE METABOLIC PANEL WITH GFR  4. Iron deficiency anemia, unspecified iron deficiency anemia type - CBC w/Diff/Platelet - Iron, TIBC and Ferritin Panel  5. Chronic pain associated with significant psychosocial dysfunction - Stable with PRN meloxicam  6. Onychomycosis - We will trial penlac, will refer to podiatry if ineffective after 6-8 weeks. - ciclopirox (PENLAC) 8 % solution; Apply topically at bedtime. Apply over nail and surrounding skin. Apply daily over previous coat. After seven (7) days, may remove with alcohol and continue cycle.  Dispense: 6.6 mL; Refill: 0  7. Tinea pedis of both feet -  will refer to podiatry if ineffective after 6-8 weeks for consideration of PO Lamisil - terbinafine (LAMISIL AT) 1 % cream; Apply 1 application topically 2 (two) times daily.  Dispense: 30 g; Refill: 0  8. Encounter for screening for HIV - HIV Antibody (routine testing w rflx)  9. Need for hepatitis C screening test - Hepatitis C antibody

## 2019-03-14 ENCOUNTER — Telehealth: Payer: Self-pay | Admitting: Family Medicine

## 2019-03-14 DIAGNOSIS — B353 Tinea pedis: Secondary | ICD-10-CM

## 2019-03-14 NOTE — Telephone Encounter (Signed)
Medication Refill - Medication: terbinafine (LAMISIL AT) 1 % cream / Pt stated that the pharmacy did not receive this rx and it needs to be resent. Please advise   Has the patient contacted their pharmacy? Yes.   (Agent: If no, request that the patient contact the pharmacy for the refill.) (Agent: If yes, when and what did the pharmacy advise?)  Preferred Pharmacy (with phone number or street name):  [id number] - [place], [place] - [phone] (Phone) [phone] (Fax)     Agent: Please be advised that RX refills may take up to 3 business days. We ask that you follow-up with your pharmacy.

## 2019-03-17 LAB — COMPLETE METABOLIC PANEL WITH GFR
AG Ratio: 1.4 (calc) (ref 1.0–2.5)
ALT: 16 U/L (ref 6–29)
AST: 16 U/L (ref 10–35)
Albumin: 4.2 g/dL (ref 3.6–5.1)
Alkaline phosphatase (APISO): 91 U/L (ref 37–153)
BUN: 13 mg/dL (ref 7–25)
CO2: 29 mmol/L (ref 20–32)
Calcium: 9.7 mg/dL (ref 8.6–10.4)
Chloride: 105 mmol/L (ref 98–110)
Creat: 0.73 mg/dL (ref 0.50–1.05)
GFR, Est African American: 108 mL/min/{1.73_m2} (ref >=60)
GFR, Est Non African American: 93 mL/min/{1.73_m2} (ref >=60)
Globulin: 3.1 g/dL (calc) (ref 1.9–3.7)
Glucose, Bld: 100 mg/dL — ABNORMAL HIGH (ref 65–99)
Potassium: 4.3 mmol/L (ref 3.5–5.3)
Sodium: 141 mmol/L (ref 135–146)
Total Bilirubin: 0.6 mg/dL (ref 0.2–1.2)
Total Protein: 7.3 g/dL (ref 6.1–8.1)

## 2019-03-17 LAB — IRON,TIBC AND FERRITIN PANEL
%SAT: 36 % (calc) (ref 16–45)
Ferritin: 225 ng/mL (ref 16–232)
Iron: 98 ug/dL (ref 45–160)
TIBC: 276 mcg/dL (calc) (ref 250–450)

## 2019-03-17 LAB — CBC WITH DIFFERENTIAL/PLATELET
Absolute Monocytes: 728 cells/uL (ref 200–950)
Basophils Absolute: 38 cells/uL (ref 0–200)
Basophils Relative: 0.5 %
Eosinophils Absolute: 150 cells/uL (ref 15–500)
Eosinophils Relative: 2 %
HCT: 36.7 % (ref 35.0–45.0)
Hemoglobin: 11.8 g/dL (ref 11.7–15.5)
Lymphs Abs: 2288 cells/uL (ref 850–3900)
MCH: 26.4 pg — ABNORMAL LOW (ref 27.0–33.0)
MCHC: 32.2 g/dL (ref 32.0–36.0)
MCV: 82.1 fL (ref 80.0–100.0)
MPV: 10.9 fL (ref 7.5–12.5)
Monocytes Relative: 9.7 %
Neutro Abs: 4298 cells/uL (ref 1500–7800)
Neutrophils Relative %: 57.3 %
Platelets: 291 10*3/uL (ref 140–400)
RBC: 4.47 10*6/uL (ref 3.80–5.10)
RDW: 13.1 % (ref 11.0–15.0)
Total Lymphocyte: 30.5 %
WBC: 7.5 10*3/uL (ref 3.8–10.8)

## 2019-03-17 LAB — HEPATITIS C ANTIBODY
Hepatitis C Ab: NONREACTIVE
SIGNAL TO CUT-OFF: 0.01 (ref <1.00)

## 2019-03-17 LAB — HIV ANTIBODY (ROUTINE TESTING W REFLEX): HIV 1&2 Ab, 4th Generation: NONREACTIVE

## 2019-03-17 MED ORDER — TERBINAFINE HCL 1 % EX CREA: 1.0000 "application " | TOPICAL_CREAM | Freq: Two times a day (BID) | CUTANEOUS | 0 refills | Status: DC

## 2019-03-18 ENCOUNTER — Ambulatory Visit: Payer: BLUE CROSS/BLUE SHIELD

## 2019-03-18 ENCOUNTER — Ambulatory Visit
Admission: RE | Admit: 2019-03-18 | Discharge: 2019-03-18 | Disposition: A | Discharge status: Home / Self Care | Payer: BLUE CROSS/BLUE SHIELD | Source: Ambulatory Visit | Attending: Family Medicine | Admitting: Family Medicine

## 2019-03-18 ENCOUNTER — Other Ambulatory Visit: Payer: Self-pay

## 2019-03-18 DIAGNOSIS — Z8271 Family history of polycystic kidney: Secondary | ICD-10-CM | POA: Diagnosis not present

## 2019-03-26 ENCOUNTER — Encounter: Payer: Self-pay | Admitting: Family Medicine

## 2019-03-26 ENCOUNTER — Other Ambulatory Visit: Payer: Self-pay | Admitting: Family Medicine

## 2019-03-26 DIAGNOSIS — B353 Tinea pedis: Secondary | ICD-10-CM

## 2019-03-26 MED ORDER — TERBINAFINE HCL 1 % EX CREA: 1.0000 "application " | TOPICAL_CREAM | Freq: Two times a day (BID) | CUTANEOUS | 0 refills | Status: DC

## 2019-05-29 ENCOUNTER — Ambulatory Visit
Admission: RE | Admit: 2019-05-29 | Discharge: 2019-05-29 | Disposition: A | Discharge status: Home / Self Care | Payer: BLUE CROSS/BLUE SHIELD | Source: Ambulatory Visit | Attending: Obstetrics and Gynecology | Admitting: Obstetrics and Gynecology

## 2019-05-29 DIAGNOSIS — Z1239 Encounter for other screening for malignant neoplasm of breast: Secondary | ICD-10-CM

## 2019-05-29 DIAGNOSIS — Z1231 Encounter for screening mammogram for malignant neoplasm of breast: Secondary | ICD-10-CM | POA: Insufficient documentation

## 2019-06-17 ENCOUNTER — Other Ambulatory Visit: Payer: Self-pay

## 2019-06-17 DIAGNOSIS — Z20822 Contact with and (suspected) exposure to covid-19: Secondary | ICD-10-CM

## 2019-06-17 DIAGNOSIS — Z20828 Contact with and (suspected) exposure to other viral communicable diseases: Secondary | ICD-10-CM

## 2019-06-19 ENCOUNTER — Ambulatory Visit (INDEPENDENT_AMBULATORY_CARE_PROVIDER_SITE_OTHER): Payer: BLUE CROSS/BLUE SHIELD | Admitting: Family Medicine

## 2019-06-19 ENCOUNTER — Encounter: Payer: Self-pay | Admitting: Family Medicine

## 2019-06-19 ENCOUNTER — Other Ambulatory Visit: Payer: Self-pay

## 2019-06-19 DIAGNOSIS — Z8271 Family history of polycystic kidney: Secondary | ICD-10-CM | POA: Diagnosis not present

## 2019-06-19 DIAGNOSIS — B351 Tinea unguium: Secondary | ICD-10-CM

## 2019-06-19 DIAGNOSIS — I10 Essential (primary) hypertension: Secondary | ICD-10-CM

## 2019-06-19 DIAGNOSIS — G894 Chronic pain syndrome: Secondary | ICD-10-CM | POA: Diagnosis not present

## 2019-06-19 DIAGNOSIS — E782 Mixed hyperlipidemia: Secondary | ICD-10-CM

## 2019-06-19 DIAGNOSIS — B353 Tinea pedis: Secondary | ICD-10-CM

## 2019-06-19 DIAGNOSIS — D509 Iron deficiency anemia, unspecified: Secondary | ICD-10-CM

## 2019-06-19 LAB — NOVEL CORONAVIRUS, NAA: SARS-CoV-2, NAA: NOT DETECTED

## 2019-06-19 MED ORDER — MELOXICAM 15 MG PO TABS: 15.0000 mg | ORAL_TABLET | Freq: Every day | ORAL | 0 refills | Status: DC

## 2019-06-19 NOTE — Progress Notes (Signed)
Name: Gail Harper   MRN: [id number]    DOB: Feb 01, 1966   Date:06/19/2019       Progress Note  Subjective  Chief Complaint  Chief Complaint  Patient presents with  . Consult    possible Covid outbreak at job    I connected with  Rozann Lesches Soyars  on 06/19/19 at  7:20 AM EDT by a video enabled telemedicine application and verified that I am speaking with the correct person using two identifiers.  I discussed the limitations of evaluation and management by telemedicine and the availability of in person appointments. The patient expressed understanding and agreed to proceed. Staff also discussed with the patient that there may be a patient responsible charge related to this service. Patient Location: Home Provider Location: Office Additional Individuals present: None  HPI  Pt presents for follow up and for possible COVID exposure:  Possible Covid-19 exposure: She was already tested due to a workplace exposure; she is asymptomatic.  She is awaiting test results.   Fungal infection of toe nail and feet: She has tried penlac in the past for her fungal infection of the bilateral 5th toenails; has been very consistent with this over the last month and it is appearing to improve.  Lamisil cream for her feet - this has improved the scaling/fungal on this significantly.  She is on lipitor - so we will avoid PO Lamisil for now.  HLD: Taking 10mg  lipitor - no chest pain, shortness of breath, or myalgias.  Last labs in [facility] 2020 and LDL was 76.  Chronic Pain: Taking 15mg  meloxicam PRN does not take daily.  Pain is usually in the LEFT upper chest or shoulder area (pulled a muscle several years ago).  Also gets some pain in the right shoulder, but this has been fairly well controlled in recent years.   HTN: Home BP's have been running in the normal range - 130's/70-80's.  Family history of HTN; she was on low dose of HCTZ at one point, but was taken off because her BP was well controlled.  She  eats a low salt diet - does not cook with salt or add it to her food at the table.  No headaches, BLE edema, chest pain, shortness of breath. Unchanged and stable.  Family history polycystic kidney disease: She has 3 siblings and her mother with PCKD.  Her kidney function has been normal over the years, no hematuria, no issues with polyuria or decreased urine output.  She did choose to go forward with a renal April 2020 which was normal.    IDA: She is taking iron supplement, has been stable.  Last Labs 3 months ago were normal.  She notes ongoing for many years, she is post-menopausal; colonoscopy is UTD.  Patient Active Problem List   Diagnosis Date Noted  . Menopause present, declines hormone replacement therapy 10/30/2017  . Ovarian cyst, left 11/04/2015  . Abnormal uterine bleeding (AUB) 11/04/2015  . Chronic pain associated with significant psychosocial dysfunction 09/09/2014  . HLD (hyperlipidemia) 09/09/2014  . BP (high blood pressure) 09/09/2014    Past Surgical History:  Procedure Laterality Date  . 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    Family History  Problem Relation Age of Onset  . Heart disease Sister   . Kidney disease Sister   . Stroke Mother   . Kidney disease Mother   . Cancer Father   . Kidney disease Brother   . Diabetes Neg Hx   .  Breast cancer Neg Hx     Social History   Socioeconomic History  . Marital status: Widowed    Spouse name: Not on file  . Number of children: 2  . Years of education: Not on file  . Highest education level: Not on file  Occupational History  . Not on file  Social Needs  . Financial resource strain: Not hard at all  . Food insecurity    Worry: Never true    Inability: Not on file  . Transportation needs    Medical: No    Non-medical: No  Tobacco Use  . Smoking status: Never Smoker  . Smokeless tobacco: Never Used  Substance and Sexual Activity  . Alcohol use: No  . Drug use: No  . Sexual activity: Not Currently     Partners: Male  Lifestyle  . Physical activity    Days per week: 2 days    Minutes per session: 30 min  . Stress: Not at all  Relationships  . Social connections    Talks on phone: More than three times a week    Gets together: More than three times a week    Attends religious service: More than 4 times per year    Active member of club or organization: Yes    Attends meetings of clubs or organizations: More than 4 times per year    Relationship status: Widowed  . Intimate partner violence    Fear of current or ex partner: No    Emotionally abused: No    Physically abused: No    Forced sexual activity: No  Other Topics Concern  . Not on file  Social History Narrative  . Not on file     Current Outpatient Medications:  .  atorvastatin (LIPITOR) 10 MG tablet, TAKE 1 TABLET BY MOUTH EVERY DAY FOR CHOLESTEROL, Disp: , Rfl: 1 .  ciclopirox (PENLAC) 8 % solution, Apply topically at bedtime. Apply over nail and surrounding skin. Apply daily over previous coat. After seven (7) days, may remove with alcohol and continue cycle., Disp: 6.6 mL, Rfl: 0 .  ferrous sulfate 325 (65 FE) MG tablet, Take 325 mg by mouth daily with breakfast., Disp: , Rfl:  .  Meloxicam 5 MG CAPS, Take by mouth., Disp: , Rfl:  .  terbinafine (LAMISIL AT) 1 % cream, Apply 1 application topically 2 (two) times daily., Disp: 42 g, Rfl: 0  No Known Allergies  I personally reviewed active problem list, medication list, allergies, health maintenance, notes from last encounter, lab results with the patient/caregiver today.   ROS  Ten systems reviewed and is negative except as mentioned in HPI  Objective  Virtual encounter, vitals not obtained.  There is no height or weight on file to calculate BMI.  Physical Exam  Constitutional: Patient appears well-developed and well-nourished. No distress.  HENT: Head: Normocephalic and atraumatic.  Neck: Normal range of motion. Pulmonary/Chest: Effort normal. No  respiratory distress. Speaking in complete sentences Neurological: Pt is alert and oriented to person, place, and time. Coordination, speech are normal.  Psychiatric: Patient has a normal mood and affect. behavior is normal. Judgment and thought content normal.  No results found for this or any previous visit (from the past 72 hour(s)).  PHQ2/9: Depression screen [facility] 2/9 06/19/2019 03/13/2019  Decreased Interest 0 0  Down, Depressed, Hopeless 0 0  PHQ - 2 Score 0 0  Altered sleeping 0 1  Tired, decreased energy 0 0  Change in appetite 0  0  Feeling bad or failure about yourself  0 0  Trouble concentrating 0 0  Moving slowly or fidgety/restless 0 0  Suicidal thoughts 0 0  PHQ-9 Score 0 1  Difficult doing work/chores Not difficult at all Not difficult at all   PHQ-2/9 Result is negative.    Fall Risk: Fall Risk  06/19/2019 03/13/2019  Falls in the past year? 0 0  Number falls in past yr: 0 0  Injury with Fall? 0 0  Follow up Falls evaluation completed Falls evaluation completed    Assessment & Plan  1. Family history of adult polycystic kidney disease - Negative Renal US; kidney function stable; plan to check kidney function about every 6 months.  2. Mixed hyperlipidemia - Tolerating lipitor  3. Essential hypertension - Doing well with DASH diet alone  4. Chronic pain associated with significant psychosocial dysfunction - meloxicam (MOBIC) 15 MG tablet; Take 1 tablet (15 mg total) by mouth daily.  Dispense: 90 tablet; Refill: 0  5. Iron deficiency anemia, unspecified iron deficiency anemia type - Taking supplement  6. Tinea pedis of both feet - Using lamisil which is improving; advised to stop after 12 weeks.  7. Onychomycosis - Using penlac; advised to stop after 12 weeks. - May consider podiatry referral vs coming off of statin therapy if PO Lamisil is warranted.   I discussed the assessment and treatment plan with the patient. The patient was provided an opportunity  to ask questions and all were answered. The patient agreed with the plan and demonstrated an understanding of the instructions.  The patient was advised to call back or seek an in-person evaluation if the symptoms worsen or if the condition fails to improve as anticipated.  I provided 21 minutes of non-face-to-face time during this encounter.

## 2019-07-16 ENCOUNTER — Other Ambulatory Visit: Payer: Self-pay | Admitting: Family Medicine

## 2019-07-29 ENCOUNTER — Other Ambulatory Visit: Payer: Self-pay

## 2019-07-29 DIAGNOSIS — Z20822 Contact with and (suspected) exposure to covid-19: Secondary | ICD-10-CM

## 2019-07-30 LAB — NOVEL CORONAVIRUS, NAA: SARS-CoV-2, NAA: NOT DETECTED

## 2019-08-22 ENCOUNTER — Encounter: Payer: Self-pay | Admitting: Family Medicine

## 2019-08-29 ENCOUNTER — Ambulatory Visit: Payer: BLUE CROSS/BLUE SHIELD | Attending: Internal Medicine

## 2019-08-29 ENCOUNTER — Other Ambulatory Visit: Payer: Self-pay

## 2019-08-29 DIAGNOSIS — Z20822 Contact with and (suspected) exposure to covid-19: Secondary | ICD-10-CM

## 2019-08-30 LAB — NOVEL CORONAVIRUS, NAA: SARS-CoV-2, NAA: NOT DETECTED

## 2019-08-31 ENCOUNTER — Encounter: Payer: Self-pay | Admitting: Family Medicine

## 2019-09-01 ENCOUNTER — Ambulatory Visit (INDEPENDENT_AMBULATORY_CARE_PROVIDER_SITE_OTHER): Payer: BLUE CROSS/BLUE SHIELD | Admitting: Family Medicine

## 2019-09-01 ENCOUNTER — Other Ambulatory Visit: Payer: Self-pay

## 2019-09-01 ENCOUNTER — Encounter: Payer: Self-pay | Admitting: Family Medicine

## 2019-09-01 DIAGNOSIS — L2489 Irritant contact dermatitis due to other agents: Secondary | ICD-10-CM | POA: Diagnosis not present

## 2019-09-01 DIAGNOSIS — L245 Irritant contact dermatitis due to other chemical products: Secondary | ICD-10-CM

## 2019-09-01 MED ORDER — TRIAMCINOLONE ACETONIDE 0.1 % EX CREA: 1.0000 "application " | TOPICAL_CREAM | Freq: Two times a day (BID) | CUTANEOUS | 0 refills | Status: DC

## 2019-09-01 NOTE — Progress Notes (Signed)
Name: Gail Harper   MRN: [id number]    DOB: 04-22-1966   Date:09/01/2019       Progress Note  Subjective  Chief Complaint  Chief Complaint  Patient presents with  . Rash    back of neck, under arms    I connected with  Rozann Lesches Brassfield  on 09/01/19 at  8:20 AM EST by a video enabled telemedicine application and verified that I am speaking with the correct person using two identifiers.  I discussed the limitations of evaluation and management by telemedicine and the availability of in person appointments. The patient expressed understanding and agreed to proceed. Staff also discussed with the patient that there may be a patient responsible charge related to this service. Patient Location: Home Provider Location: Office Additional Individuals present: None  HPI  Pt presents with concern for rash:  Axillary rash: Started new deodorant - photos are available in MyChart message.  She switched to using a natural deodorant with baking soda.  She stopped using it about a week ago and the rash is improving on its own.  Recommend avoiding deodorant with baking soda. She will switch back to [employer].  Posterior Neck rash: Started about a month ago - photos are available in MyChart.  She wears a mask that has strap that rests on the neck - thinks this may have contributed; also wears a scarf.  She is trying hydrocortisone cream and the rash is improving - redness has improved, itching has improved.   Patient Active Problem List   Diagnosis Date Noted  . Menopause present, declines hormone replacement therapy 10/30/2017  . Ovarian cyst, left 11/04/2015  . Abnormal uterine bleeding (AUB) 11/04/2015  . Chronic pain associated with significant psychosocial dysfunction 09/09/2014  . HLD (hyperlipidemia) 09/09/2014  . BP (high blood pressure) 09/09/2014    Social History   Tobacco Use  . Smoking status: Never Smoker  . Smokeless tobacco: Never Used  Substance Use Topics  . Alcohol use: No      Current Outpatient Medications:  .  atorvastatin (LIPITOR) 10 MG tablet, TAKE 1 TABLET BY MOUTH EVERY DAY, Disp: 90 tablet, Rfl: 3 .  ciclopirox (PENLAC) 8 % solution, Apply topically at bedtime. Apply over nail and surrounding skin. Apply daily over previous coat. After seven (7) days, may remove with alcohol and continue cycle., Disp: 6.6 mL, Rfl: 0 .  ferrous sulfate 325 (65 FE) MG tablet, Take 325 mg by mouth daily with breakfast., Disp: , Rfl:  .  meloxicam (MOBIC) 15 MG tablet, Take 1 tablet (15 mg total) by mouth daily., Disp: 90 tablet, Rfl: 0 .  terbinafine (LAMISIL AT) 1 % cream, Apply 1 application topically 2 (two) times daily., Disp: 42 g, Rfl: 0  No Known Allergies  I personally reviewed active problem list, medication list, allergies, notes from last encounter, lab results with the patient/caregiver today.  ROS  Constitutional: Negative for fever or weight change.  Respiratory: Negative for cough and shortness of breath.   Cardiovascular: Negative for chest pain or palpitations.  Gastrointestinal: Negative for abdominal pain, no bowel changes.  Musculoskeletal: Negative for gait problem or joint swelling.  Skin: Positive for rash.  Neurological: Negative for dizziness or headache.  No other specific complaints in a complete review of systems (except as listed in HPI above)  Objective  Virtual encounter, vitals not obtained.  There is no height or weight on file to calculate BMI.  Nursing Note and Vital Signs reviewed.  Physical  Exam  Constitutional: Patient appears well-developed and well-nourished. No distress.  HENT: Head: Normocephalic and atraumatic.  Neck: Normal range of motion. Pulmonary/Chest: Effort normal. No respiratory distress. Speaking in complete sentences Neurological: Pt is alert and oriented to person, place, and time. Coordination, speech and gait are normal.  Psychiatric: Patient has a normal mood and affect. behavior is normal. Judgment  and thought content normal. Skin: Axillary rash is improving - hyperpigmentation bilaterally - see Mychart photos; rash on neck has maculopapular mildly erythematous lesions that are also improving.  Results for orders placed or performed in visit on 08/29/19 (from the past 72 hour(s))  Novel Coronavirus, NAA ([facility])     Status: None   Collection Time: 08/29/19  8:27 AM   Specimen: Nasopharyngeal(NP) swabs in vial transport medium   NASOPHARYNGE  TESTING  Result Value Ref Range   SARS-CoV-2, NAA Not Detected Not Detected    Comment: This nucleic acid amplification test was developed and its performance characteristics determined by [employer]. Nucleic acid amplification tests include PCR and TMA. This test has not been FDA cleared or approved. This test has been authorized by FDA under an Emergency Use Authorization (EUA). This test is only authorized for the duration of time the declaration that circumstances exist justifying the authorization of the emergency use of in vitro diagnostic tests for detection of SARS-CoV-2 virus and/or diagnosis of COVID-19 infection under section 564(b)(1) of the Act, 21 U.S.C. 465KPT-4(S) (1), unless the authorization is terminated or revoked sooner. When diagnostic testing is negative, the possibility of a false negative result should be considered in the context of a patient's recent exposures and the presence of clinical signs and symptoms consistent with COVID-19. An individual without symptoms of COVID-19 and who is not shedding SARS-CoV-2 virus would  expect to have a negative (not detected) result in this assay.     Assessment & Plan  1. Irritant contact dermatitis due to other chemical products - Avoid baking soda based deodorants, discussed other natural products as alternative.   2. Irritant contact dermatitis due to other agents - Will keep triamcinolone on hand as she cannot avoid wearing the type of mask that her employer  requires, and the rash may return.  Keep neck as clean and dry as possible. - triamcinolone cream (KENALOG) 0.1 %; Apply 1 application topically 2 (two) times daily.  Dispense: 30 g; Refill: 0   -Red flags and when to present for emergency care or RTC including fever >101.51F, chest pain, shortness of breath, new/worsening/un-resolving symptoms, reviewed with patient at time of visit. Follow up and care instructions discussed and provided in AVS. - I discussed the assessment and treatment plan with the patient. The patient was provided an opportunity to ask questions and all were answered. The patient agreed with the plan and demonstrated an understanding of the instructions.  I provided 11 minutes of non-face-to-face time during this encounter.  Hubbard Hartshorn, FNP

## 2019-09-15 ENCOUNTER — Other Ambulatory Visit: Payer: Self-pay | Admitting: Family Medicine

## 2019-09-15 DIAGNOSIS — G894 Chronic pain syndrome: Secondary | ICD-10-CM

## 2019-09-17 ENCOUNTER — Ambulatory Visit: Payer: 59 | Attending: Internal Medicine

## 2019-09-17 DIAGNOSIS — Z20822 Contact with and (suspected) exposure to covid-19: Secondary | ICD-10-CM | POA: Insufficient documentation

## 2019-09-19 ENCOUNTER — Other Ambulatory Visit: Payer: BLUE CROSS/BLUE SHIELD

## 2019-09-19 LAB — NOVEL CORONAVIRUS, NAA: SARS-CoV-2, NAA: NOT DETECTED

## 2019-10-20 ENCOUNTER — Ambulatory Visit
Admission: EM | Admit: 2019-10-20 | Discharge: 2019-10-20 | Disposition: A | Discharge status: Home / Self Care | Payer: 59 | Attending: Family Medicine | Admitting: Family Medicine

## 2019-10-20 ENCOUNTER — Other Ambulatory Visit: Payer: Self-pay

## 2019-10-20 ENCOUNTER — Encounter: Payer: Self-pay | Admitting: Family Medicine

## 2019-10-20 DIAGNOSIS — M62838 Other muscle spasm: Secondary | ICD-10-CM

## 2019-10-20 DIAGNOSIS — M898X1 Other specified disorders of bone, shoulder: Secondary | ICD-10-CM

## 2019-10-20 MED ORDER — TIZANIDINE HCL 4 MG PO TABS: 4.0000 mg | ORAL_TABLET | Freq: Three times a day (TID) | ORAL | 0 refills | Status: DC | PRN

## 2019-10-20 MED ORDER — DICLOFENAC SODIUM 75 MG PO TBEC: 75.0000 mg | DELAYED_RELEASE_TABLET | Freq: Two times a day (BID) | ORAL | 0 refills | Status: DC | PRN

## 2019-10-20 NOTE — ED Provider Notes (Signed)
[facility]    CSN: [id number] Arrival date & time: 10/20/19  0947      History   Chief Complaint Chief Complaint  Patient presents with  . Back Pain   HPI  54 year old female presents with pain around the right scapula.  Started on Monday last week.  Improved over the next few days and then resolved.  Recurred again yesterday.  Pain is around the inferior aspect of her scapula.  She states that it feels like a spasm.  Has improvement with heating pad.  No other relieving factors.  No known exacerbating factors.  No recent fall, trauma, injury.  No other associated symptoms.  No other complaints  Past Medical History:  Diagnosis Date  . Anemia   . High cholesterol    Patient Active Problem List   Diagnosis Date Noted  . Menopause present, declines hormone replacement therapy 10/30/2017  . Ovarian cyst, left 11/04/2015  . Abnormal uterine bleeding (AUB) 11/04/2015  . Chronic pain associated with significant psychosocial dysfunction 09/09/2014  . HLD (hyperlipidemia) 09/09/2014  . BP (high blood pressure) 09/09/2014    Past Surgical History:  Procedure Laterality Date  . 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    OB History    Gravida  2   Para  2   Term  2   Preterm      AB      Living  2     SAB      TAB      Ectopic      Multiple      Live Births  2            Home Medications    Prior to Admission medications   Medication Sig Start Date End Date Taking? Authorizing Provider  atorvastatin (LIPITOR) 10 MG tablet TAKE 1 TABLET BY MOUTH EVERY DAY 07/17/19   Hubbard Hartshorn, FNP  ciclopirox [facility]) 8 % solution Apply topically at bedtime. Apply over nail and surrounding skin. Apply daily over previous coat. After seven (7) days, may remove with alcohol and continue cycle. 03/13/19   Hubbard Hartshorn, FNP  diclofenac (VOLTAREN) 75 MG EC tablet Take 1 tablet (75 mg total) by mouth 2 (two) times daily as needed for moderate pain. 10/20/19    Coral Spikes, DO  ferrous sulfate 325 (65 FE) MG tablet Take 325 mg by mouth daily with breakfast.    [provider]  terbinafine (LAMISIL AT) 1 % cream Apply 1 application topically 2 (two) times daily. 03/26/19   Steele Sizer, MD  tiZANidine (ZANAFLEX) 4 MG tablet Take 1 tablet (4 mg total) by mouth every 8 (eight) hours as needed for muscle spasms. 10/20/19   Coral Spikes, DO  triamcinolone cream (KENALOG) 0.1 % Apply 1 application topically 2 (two) times daily. 09/01/19   Hubbard Hartshorn, FNP    Family History Family History  Problem Relation Age of Onset  . Heart disease Sister   . Kidney disease Sister   . Stroke Mother   . Kidney disease Mother   . Cancer Father   . Kidney disease Brother   . Diabetes Neg Hx   . Breast cancer Neg Hx     Social History Social History   Tobacco Use  . Smoking status: Never Smoker  . Smokeless tobacco: Never Used  Substance Use Topics  . Alcohol use: No  . Drug use: No    Allergies   Patient  has no known allergies.  Review of Systems Review of Systems  Constitutional: Negative.   Musculoskeletal:       Pain - scapula (right)   Physical Exam Triage Vital Signs ED Triage Vitals  Enc Vitals Group     BP 10/20/19 1002 (!) 164/89     Pulse Rate 10/20/19 1002 95     Resp 10/20/19 1002 16     Temp 10/20/19 1002 98.4 F (36.9 C)     Temp Source 10/20/19 1002 Oral     SpO2 10/20/19 1002 100 %     Weight 10/20/19 1004 155 lb (70.3 kg)     Height 10/20/19 1004 5\' 3"  (1.6 m)     Head Circumference --      Peak Flow --      Pain Score 10/20/19 1003 5     Pain Loc --      Pain Edu? --      Excl. in GC? --    Updated Vital Signs BP (!) 164/89 (BP Location: Right Arm)   Pulse 95   Temp 98.4 F (36.9 C) (Oral)   Resp 16   Ht 5\' 3"  (1.6 m)   Wt 70.3 kg   LMP 10/17/2016   SpO2 100%   BMI 27.46 kg/m   Visual Acuity Right Eye Distance:   Left Eye Distance:   Bilateral Distance:    Right Eye Near:   Left Eye  Near:    Bilateral Near:     Physical Exam Vitals and nursing note reviewed.  Constitutional:      General: She is not in acute distress.    Appearance: Normal appearance. She is not ill-appearing.  HENT:     Head: Normocephalic and atraumatic.  Eyes:     General:        Right eye: No discharge.        Left eye: No discharge.     Conjunctiva/sclera: Conjunctivae normal.  Cardiovascular:     Rate and Rhythm: Normal rate and regular rhythm.     Heart sounds: No murmur.  Pulmonary:     Effort: Pulmonary effort is normal.     Breath sounds: Normal breath sounds. No wheezing, rhonchi or rales.  Musculoskeletal:     Comments: Thoracic spine and right scapula -no discrete areas of tenderness on exam  Neurological:     Mental Status: She is alert.  Psychiatric:        Mood and Affect: Mood normal.        Behavior: Behavior normal.    UC Treatments / Results  Labs (all labs ordered are listed, but only abnormal results are displayed) Labs Reviewed - No data to display  EKG   Radiology No results found.  Procedures Procedures (including critical care time)  Medications Ordered in UC Medications - No data to display  Initial Impression / Assessment and Plan / UC Course  I have reviewed the triage vital signs and the nursing notes.  Pertinent labs & imaging results that were available during my care of the patient were reviewed by me and considered in my medical decision making (see chart for details).    54 year old female presents with periscapular pain and muscle spasm.  Treating with diclofenac and Zanaflex.  Final Clinical Impressions(s) / UC Diagnoses   Final diagnoses:  Periscapular pain  Muscle spasm     Discharge Instructions     Rest.  Heat.  Medications as directed.  Take care  Dr. 12/15/2016  ED Prescriptions    Medication Sig Dispense Auth. Provider   diclofenac (VOLTAREN) 75 MG EC tablet Take 1 tablet (75 mg total) by mouth 2 (two) times  daily as needed for moderate pain. 14 tablet Romulo Okray G, DO   tiZANidine (ZANAFLEX) 4 MG tablet Take 1 tablet (4 mg total) by mouth every 8 (eight) hours as needed for muscle spasms. 30 tablet Tommie Sams, DO     PDMP not reviewed this encounter.   Tommie Sams, [place] 10/20/19 1025

## 2019-10-20 NOTE — ED Triage Notes (Signed)
Pain to right mid back under shoulder blade x one week. Started as a "strong cramp." Has been coming and going until yesterday when it became more constant. Feels like a spasm. Heating pad seems to help

## 2019-10-20 NOTE — Discharge Instructions (Signed)
Rest.  Heat.  Medications as directed.  Take care  Dr. Kendal Ghazarian  

## 2019-10-20 NOTE — Telephone Encounter (Signed)
Pt directed to ER by CMA Marcos Eke this morning.  I agree with disposition as patient is c/o chest pain.

## 2019-11-20 ENCOUNTER — Encounter: Payer: BLUE CROSS/BLUE SHIELD | Admitting: Obstetrics and Gynecology

## 2019-11-21 ENCOUNTER — Telehealth: Payer: Self-pay | Admitting: Obstetrics and Gynecology

## 2019-11-21 NOTE — Telephone Encounter (Signed)
Called patient to see if she would like to reschedule the appointment that was cancelled due to provider being out of the office. LVM for her to return my phone call.

## 2019-12-22 ENCOUNTER — Ambulatory Visit: Payer: 59 | Attending: Internal Medicine

## 2019-12-22 ENCOUNTER — Other Ambulatory Visit: Payer: Self-pay

## 2019-12-22 DIAGNOSIS — Z23 Encounter for immunization: Secondary | ICD-10-CM

## 2019-12-22 NOTE — Progress Notes (Signed)
   Covid-19 Vaccination Clinic  Name:  Gail Harper    MRN: [id number] DOB: 01-Oct-1965  12/22/2019  Gail Harper was observed post Covid-19 immunization for 15 minutes without incident. She was provided with Vaccine Information Sheet and instruction to access the V-Safe system.   Gail Harper was instructed to call 911 with any severe reactions post vaccine: Marland Kitchen Difficulty breathing  . Swelling of face and throat  . A fast heartbeat  . A bad rash all over body  . Dizziness and weakness   Immunizations Administered    Name Date Dose VIS Date Route   Pfizer COVID-19 Vaccine 12/22/2019 11:02 AM 0.3 mL 08/22/2019 Intramuscular   Manufacturer: [employer], [employer]   Lot: [id number]   NDC: [address]-7

## 2020-01-13 ENCOUNTER — Ambulatory Visit: Payer: 59 | Attending: Internal Medicine

## 2020-01-13 DIAGNOSIS — Z23 Encounter for immunization: Secondary | ICD-10-CM

## 2020-01-13 NOTE — Progress Notes (Signed)
   Covid-19 Vaccination Clinic  Name:  Gail Harper    MRN: [id number] DOB: 1966/06/26  01/13/2020  Ms. Study was observed post Covid-19 immunization for 15 minutes without incident. She was provided with Vaccine Information Sheet and instruction to access the V-Safe system.   [name] was instructed to call 911 with any severe reactions post vaccine: Marland Kitchen Difficulty breathing  . Swelling of face and throat  . A fast heartbeat  . A bad rash all over body  . Dizziness and weakness   Immunizations Administered    Name Date Dose VIS Date Route   Pfizer COVID-19 Vaccine 01/13/2020  2:40 PM 0.3 mL 11/05/2018 Intramuscular   Manufacturer: [employer], [employer]   Lot: [id number]   NDC: [address]-4

## 2020-02-03 ENCOUNTER — Ambulatory Visit: Payer: BLUE CROSS/BLUE SHIELD | Admitting: Family Medicine

## 2020-03-17 ENCOUNTER — Encounter: Payer: Self-pay | Admitting: Family Medicine

## 2020-03-17 ENCOUNTER — Other Ambulatory Visit: Payer: Self-pay

## 2020-03-17 ENCOUNTER — Ambulatory Visit (INDEPENDENT_AMBULATORY_CARE_PROVIDER_SITE_OTHER): Payer: 59 | Admitting: Family Medicine

## 2020-03-17 VITALS — BP 130/78 | HR 92 | Temp 97.3°F | Resp 16 | Ht 63.0 in | Wt 157.0 lb

## 2020-03-17 DIAGNOSIS — I10 Essential (primary) hypertension: Secondary | ICD-10-CM | POA: Diagnosis not present

## 2020-03-17 DIAGNOSIS — E782 Mixed hyperlipidemia: Secondary | ICD-10-CM | POA: Diagnosis not present

## 2020-03-17 DIAGNOSIS — E663 Overweight: Secondary | ICD-10-CM

## 2020-03-17 DIAGNOSIS — D509 Iron deficiency anemia, unspecified: Secondary | ICD-10-CM

## 2020-03-17 DIAGNOSIS — Z8271 Family history of polycystic kidney: Secondary | ICD-10-CM

## 2020-03-17 DIAGNOSIS — Z5181 Encounter for therapeutic drug level monitoring: Secondary | ICD-10-CM | POA: Diagnosis not present

## 2020-03-17 DIAGNOSIS — Z0000 Encounter for general adult medical examination without abnormal findings: Secondary | ICD-10-CM

## 2020-03-17 DIAGNOSIS — B351 Tinea unguium: Secondary | ICD-10-CM

## 2020-03-17 LAB — IRON,TIBC AND FERRITIN PANEL
%SAT: 34 % (calc) (ref 16–45)
Ferritin: 220 ng/mL (ref 16–232)
Iron: 101 ug/dL (ref 45–160)
TIBC: 298 mcg/dL (calc) (ref 250–450)

## 2020-03-17 LAB — CBC WITH DIFFERENTIAL/PLATELET
Absolute Monocytes: 679 cells/uL (ref 200–950)
Basophils Absolute: 52 cells/uL (ref 0–200)
Basophils Relative: 0.6 %
Eosinophils Absolute: 157 cells/uL (ref 15–500)
Eosinophils Relative: 1.8 %
HCT: 38.3 % (ref 35.0–45.0)
Hemoglobin: 12.3 g/dL (ref 11.7–15.5)
Lymphs Abs: 2923 cells/uL (ref 850–3900)
MCH: 26.6 pg — ABNORMAL LOW (ref 27.0–33.0)
MCHC: 32.1 g/dL (ref 32.0–36.0)
MCV: 82.9 fL (ref 80.0–100.0)
MPV: 10.7 fL (ref 7.5–12.5)
Monocytes Relative: 7.8 %
Neutro Abs: 4889 cells/uL (ref 1500–7800)
Neutrophils Relative %: 56.2 %
Platelets: 301 10*3/uL (ref 140–400)
RBC: 4.62 10*6/uL (ref 3.80–5.10)
RDW: 13.1 % (ref 11.0–15.0)
Total Lymphocyte: 33.6 %
WBC: 8.7 10*3/uL (ref 3.8–10.8)

## 2020-03-17 LAB — COMPLETE METABOLIC PANEL WITH GFR
AG Ratio: 1.5 (calc) (ref 1.0–2.5)
ALT: 18 U/L (ref 6–29)
AST: 18 U/L (ref 10–35)
Albumin: 4.5 g/dL (ref 3.6–5.1)
Alkaline phosphatase (APISO): 109 U/L (ref 37–153)
BUN: 11 mg/dL (ref 7–25)
CO2: 29 mmol/L (ref 20–32)
Calcium: 10.2 mg/dL (ref 8.6–10.4)
Chloride: 104 mmol/L (ref 98–110)
Creat: 0.62 mg/dL (ref 0.50–1.05)
GFR, Est African American: 118 mL/min/{1.73_m2} (ref >=60)
GFR, Est Non African American: 102 mL/min/{1.73_m2} (ref >=60)
Globulin: 3.1 g/dL (calc) (ref 1.9–3.7)
Glucose, Bld: 87 mg/dL (ref 65–99)
Potassium: 4.1 mmol/L (ref 3.5–5.3)
Sodium: 141 mmol/L (ref 135–146)
Total Bilirubin: 0.5 mg/dL (ref 0.2–1.2)
Total Protein: 7.6 g/dL (ref 6.1–8.1)

## 2020-03-17 LAB — LIPID PANEL
Cholesterol: 200 mg/dL — ABNORMAL HIGH (ref <200)
HDL: 48 mg/dL — ABNORMAL LOW (ref >=50)
LDL Cholesterol (Calc): 120 mg/dL (calc) — ABNORMAL HIGH
Non-HDL Cholesterol (Calc): 152 mg/dL (calc) — ABNORMAL HIGH (ref <130)
Total CHOL/HDL Ratio: 4.2 (calc) (ref <5.0)
Triglycerides: 202 mg/dL — ABNORMAL HIGH (ref <150)

## 2020-03-17 MED ORDER — CICLOPIROX 8 % EX SOLN: Freq: Every day | CUTANEOUS | 0 refills | Status: DC

## 2020-03-17 MED ORDER — ATORVASTATIN CALCIUM 10 MG PO TABS: 10.0000 mg | ORAL_TABLET | Freq: Every day | ORAL | 3 refills | Status: DC

## 2020-03-17 NOTE — Progress Notes (Signed)
Patient: Gail Harper, Female    DOB: Jan 29, 1966, 54 y.o.   MRN: [id number] Delsa Grana, PA-C Visit Date: 03/17/2020  Today's Provider: Delsa Grana, PA-C   Chief Complaint  Patient presents with   Annual Exam    no pap   Subjective:   Annual physical exam:  Gail Harper is a 54 y.o. female who presents today for complete physical exam:  Exercise/Activity:  Trying, walk and jog with her daughter, she gets on the treadmill a couple of other times a week Diet/nutrition:   Loves veggies, trying to avoid unhealthy food, Snacks late Sleep:  Not sleeping as well as she would like to sleep, some hot flashes  Patient is up-to-date on her mammogram, breast exam, Pap and declines any breast exam or GU exam today  Weight up a little bit over the past 1-2 years, her job has become more sedentary, she used to be on the computer but have to go walk around the floor quite a bit now she is on the computer much more often. In 12-Nov-2015 her husband passed away and shortly afterwards her sister who she was very close with also passed away so she lost weight below what she was normally, gained that back through November 11, 2017 and 2018-11-11  Wt Readings from Last 20 Encounters:  03/17/20 157 lb (71.2 kg)  10/20/19 155 lb (70.3 kg)  03/13/19 150 lb 14.4 oz (68.4 kg)  11/19/18 149 lb (67.6 kg)  10/30/17 146 lb 8 oz (66.5 kg)  06/26/17 141 lb 9.6 oz (64.2 kg)  10/24/16 144 lb 11.2 oz (65.6 kg)  06/27/16 144 lb 1.6 oz (65.4 kg)  05/17/16 146 lb 3.2 oz (66.3 kg)  11/16/15 141 lb 5 oz (64.1 kg)  11/04/15 141 lb 1.6 oz (64 kg)  10/21/15 141 lb 14.4 oz (64.4 kg)   BMI Readings from Last 5 Encounters:  03/17/20 27.81 kg/m  10/20/19 27.46 kg/m  03/13/19 26.73 kg/m  11/19/18 26.39 kg/m  10/30/17 25.95 kg/m   She does have iron deficiency anemia and is on ferrous sulfate supplementation, her PCP was managing that Hx of IDA for many years, post-menopausal for at least 2-3 years, denies melena,  hematochezia.  Colonoscopy UTD.   Hemoglobin  Date Value Ref Range Status  03/13/2019 11.8 11.7 - 15.5 g/dL Final  last hemoglobin was normal, last iron panel was done July 2020 - wnl Cell indices also normal with the exception of MCH just outside low normal   Hyperlipidemia: Currently treated with lipitor 10 mg, pt reports good med compliance Last Lipids: Lab Results  Component Value Date   CHOL 149 11/19/2018   HDL 46 11/19/2018   LDLCALC 76 11/19/2018   TRIG 136 11/19/2018   CHOLHDL 3.2 11/19/2018  - Denies: Chest pain, shortness of breath, myalgias, claudication  Family hx of PCKD -her mother, sister and brother have all passed away from complications secondary to polycystic kidney disease.  Her other sister recently had a stroke about 1 year ago, was then also diagnosed with polycystic kidney disease.  Her PCP previously did a work-up including a renal ultrasound and labs.  Patient has never consulted with a nephrologist or done any genetic testing that she knows of for her or for her family members.  She denies any hematuria, peripheral edema, changes in urine output, or any bubbles with urination  Pt wished to do routine f/up on chronic conditions today in addition to CPE. Advised pt of separate visit billing/coding  USPSTF  grade A and B recommendations - reviewed and addressed today  Depression:   Some mild and intermittent depression and anxiety over the past 3 to 4 years with the passing of her husband, her sister and last year with her other sister having a stroke and Covid pandemic, some guilt and difficulty helping her and being there for her due to lockdown the limitations at the time Phq 9 completed today by patient, was reviewed by me with patient in the room PHQ score is negative, pt feels mood is good PHQ 2/9 Scores 03/17/2020 09/01/2019 06/19/2019 03/13/2019  PHQ - 2 Score 0 0 0 0  PHQ- 9 Score 1 0 0 1   Depression screen [facility] 2/9 03/17/2020 09/01/2019 06/19/2019 03/13/2019    Decreased Interest 0 0 0 0  Down, Depressed, Hopeless 0 0 0 0  PHQ - 2 Score 0 0 0 0  Altered sleeping 1 0 0 1  Tired, decreased energy 0 0 0 0  Change in appetite 0 0 0 0  Feeling bad or failure about yourself  0 0 0 0  Trouble concentrating 0 0 0 0  Moving slowly or fidgety/restless 0 0 0 0  Suicidal thoughts 0 0 0 0  PHQ-9 Score 1 0 0 1  Difficult doing work/chores Not difficult at all Not difficult at all Not difficult at all Not difficult at all    Alcohol screening:   Office Visit from 03/17/2020 in [facility]  AUDIT-C Score 0      Immunizations and Health Maintenance: Health Maintenance  Topic Date Due   TETANUS/TDAP  Never done   INFLUENZA VACCINE  04/11/2020   MAMMOGRAM  05/28/2021   PAP SMEAR-Modifier  11/18/2021   COLONOSCOPY  11/16/2024   COVID-19 Vaccine  Completed   Hepatitis C Screening  Completed   HIV Screening  Completed     Hep C Screening: done  STD testing and prevention (HIV/chl/gon/syphilis):  see above, no additional testing desired by pt today -completed previously, no new sexual partners  Intimate partner violence:none -safe  Sexual History/Pain during Intercourse: Widowed  Menstrual History/LMP/Abnormal Bleeding:   Menopausal, no abnormal bleeding Patient's last menstrual period was 10/17/2016.  Incontinence Symptoms:  None   Breast cancer: done with [facility] Last Mammogram: *see HM list above BRCA gene screening: none  Cervical cancer screening: UTD Pt denies family hx of cancers - breast, ovarian, uterine, colon:     Osteoporosis:   Discussion on osteoporosis per age, including high calcium and vitamin D supplementation, weight bearing exercises Pt is not supplementing with daily calcium/Vit D.    She does chew Tums twice a day   Skin cancer:  Hx of skin CA -  NO Discussed atypical lesions   Colorectal cancer:   Colonoscopy is up-to-date Discussed concerning signs and sx of CRC, pt denies  melena, hematochezia, change in bowel patterns or stool caliber  Lung cancer:   Low Dose CT Chest recommended if Age 47-80 years, 30 pack-year currently smoking OR have quit w/in 15years. Patient does not qualify.    Social History   Tobacco Use   Smoking status: Never Smoker   Smokeless tobacco: Never Used  Vaping Use   Vaping Use: Never used  Substance Use Topics   Alcohol use: No   Drug use: No       Office Visit from 03/17/2020 in [facility]  AUDIT-C Score 0      Family History  Problem Relation Age of  Onset   Heart disease Sister    Kidney disease Sister    Polycystic kidney disease Sister    Stroke Mother    Kidney disease Mother    Polycystic kidney disease Mother    Cancer Father    Kidney disease Brother    Polycystic kidney disease Brother    Stroke Sister 56   Polycystic kidney disease Sister    Diabetes Neg Hx    Breast cancer Neg Hx      Blood pressure/Hypertension: BP Readings from Last 3 Encounters:  03/17/20 130/78  10/20/19 (!) 164/89  03/13/19 128/70    Weight/Obesity: Wt Readings from Last 3 Encounters:  03/17/20 157 lb (71.2 kg)  10/20/19 155 lb (70.3 kg)  03/13/19 150 lb 14.4 oz (68.4 kg)   BMI Readings from Last 3 Encounters:  03/17/20 27.81 kg/m  10/20/19 27.46 kg/m  03/13/19 26.73 kg/m     Lipids:  Lab Results  Component Value Date   CHOL 149 11/19/2018   Lab Results  Component Value Date   HDL 46 11/19/2018   Lab Results  Component Value Date   LDLCALC 76 11/19/2018   Lab Results  Component Value Date   TRIG 136 11/19/2018   Lab Results  Component Value Date   CHOLHDL 3.2 11/19/2018   No results found for: LDLDIRECT Based on the results of lipid panel his/her cardiovascular risk factor ( using [place] )  in the next 10 years is: The 10-year ASCVD risk score Mikey Bussing DC Brooke Bonito., et al., 2013) is: 3.1%   Values used to calculate the score:     Age: 89 years     Sex:  Female     Is Non-Hispanic African American: Yes     Diabetic: No     Tobacco smoker: No     Systolic Blood Pressure: 703 mmHg     Is BP treated: No     HDL Cholesterol: 46 mg/dL     Total Cholesterol: 149 mg/dL Glucose:  Glucose, Bld  Date Value Ref Range Status  03/13/2019 100 (H) 65 - 99 mg/dL Final    Comment:    .            Fasting reference interval . For someone without known diabetes, a glucose value between 100 and 125 mg/dL is consistent with prediabetes and should be confirmed with a follow-up test. .    Hypertension: BP Readings from Last 3 Encounters:  03/17/20 130/78  10/20/19 (!) 164/89  03/13/19 128/70   Obesity: Wt Readings from Last 3 Encounters:  03/17/20 157 lb (71.2 kg)  10/20/19 155 lb (70.3 kg)  03/13/19 150 lb 14.4 oz (68.4 kg)   BMI Readings from Last 3 Encounters:  03/17/20 27.81 kg/m  10/20/19 27.46 kg/m  03/13/19 26.73 kg/m      Social History      She        Social History   Socioeconomic History   Marital status: Widowed    Spouse name: Not on file   Number of children: 2   Years of education: Not on file   Highest education level: Not on file  Occupational History   Not on file  Tobacco Use   Smoking status: Never Smoker   Smokeless tobacco: Never Used  Vaping Use   Vaping Use: Never used  Substance and Sexual Activity   Alcohol use: No   Drug use: No   Sexual activity: Not Currently    Partners: Male  Other  Topics Concern   Not on file  Social History Narrative   Not on file   Social Determinants of Health   Financial Resource Strain:    Difficulty of Paying Living Expenses:   Food Insecurity:    Worried About Charity fundraiser in the Last Year:    Arboriculturist in the Last Year:   Transportation Needs:    Film/video editor (Medical):    Lack of Transportation (Non-Medical):   Physical Activity:    Days of Exercise per Week:    Minutes of Exercise per Session:   Stress:     Feeling of Stress :   Social Connections:    Frequency of Communication with Friends and Family:    Frequency of Social Gatherings with Friends and Family:    Attends Religious Services:    Active Member of Clubs or Organizations:    Attends Music therapist:    Marital Status:     Family History        Family History  Problem Relation Age of Onset   Heart disease Sister    Kidney disease Sister    Polycystic kidney disease Sister    Stroke Mother    Kidney disease Mother    Polycystic kidney disease Mother    Cancer Father    Kidney disease Brother    Polycystic kidney disease Brother    Stroke Sister 78   Polycystic kidney disease Sister    Diabetes Neg Hx    Breast cancer Neg Hx     Patient Active Problem List   Diagnosis Date Noted   Iron deficiency anemia 03/17/2020   Family history of adult polycystic kidney disease 03/17/2020   Menopause present, declines hormone replacement therapy 10/30/2017   Ovarian cyst, left 11/04/2015   Abnormal uterine bleeding (AUB) 11/04/2015   Chronic pain associated with significant psychosocial dysfunction 09/09/2014   HLD (hyperlipidemia) 09/09/2014   BP (high blood pressure) 09/09/2014    Past Surgical History:  Procedure Laterality Date   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     Current Outpatient Medications:    atorvastatin (LIPITOR) 10 MG tablet, Take 1 tablet (10 mg total) by mouth at bedtime., Disp: 90 tablet, Rfl: 3   ciclopirox (PENLAC) 8 % solution, Apply topically at bedtime. Apply over nail and surrounding skin. Apply daily over previous coat. After seven (7) days, may remove with alcohol and continue cycle., Disp: 6.6 mL, Rfl: 0   diclofenac (VOLTAREN) 75 MG EC tablet, Take 1 tablet (75 mg total) by mouth 2 (two) times daily as needed for moderate pain., Disp: 14 tablet, Rfl: 0   ferrous sulfate 325 (65 FE) MG tablet, Take 325 mg by mouth daily with breakfast., Disp:  , Rfl:    terbinafine (LAMISIL AT) 1 % cream, Apply 1 application topically 2 (two) times daily., Disp: 42 g, Rfl: 0   tiZANidine (ZANAFLEX) 4 MG tablet, Take 1 tablet (4 mg total) by mouth every 8 (eight) hours as needed for muscle spasms., Disp: 30 tablet, Rfl: 0   triamcinolone cream (KENALOG) 0.1 %, Apply 1 application topically 2 (two) times daily., Disp: 30 g, Rfl: 0  No Known Allergies  Patient Care Team: Delsa Grana, PA-C as PCP - General (Family Medicine)  Review of Systems  Constitutional: Negative.  Negative for activity change, appetite change, fatigue and unexpected weight change.  HENT: Negative.   Eyes: Negative.   Respiratory: Negative.  Negative for shortness  of breath.   Cardiovascular: Negative.  Negative for chest pain, palpitations and leg swelling.  Gastrointestinal: Negative.  Negative for abdominal pain and blood in stool.  Endocrine: Negative.   Genitourinary: Negative.   Musculoskeletal: Negative.  Negative for arthralgias, gait problem, joint swelling and myalgias.  Skin: Negative.  Negative for color change, pallor and rash.  Allergic/Immunologic: Negative.   Neurological: Negative.  Negative for syncope and weakness.  Hematological: Negative.   Psychiatric/Behavioral: Negative.  Negative for confusion, dysphoric mood, self-injury and suicidal ideas. The patient is not nervous/anxious.   All other systems reviewed and are negative.    I personally reviewed active problem list, medication list, allergies, family history, social history, health maintenance, notes from last encounter, lab results, imaging with the patient/caregiver today.        Objective:   Vitals:  Vitals:   03/17/20 1119  BP: 130/78  Pulse: 92  Resp: 16  Temp: (!) 97.3 F (36.3 C)  TempSrc: Temporal  SpO2: 97%  Weight: 157 lb (71.2 kg)  Height: 5' 3"  (1.6 m)    Body mass index is 27.81 kg/m.  Physical Exam Vitals and nursing note reviewed.  Constitutional:       General: She is not in acute distress.    Appearance: Normal appearance. She is well-developed. She is not ill-appearing, toxic-appearing or diaphoretic.  HENT:     Head: Normocephalic and atraumatic.     Right Ear: External ear normal.     Left Ear: External ear normal.     Mouth/Throat:     Pharynx: Uvula midline.  Eyes:     General: Lids are normal. No scleral icterus.       Right eye: No discharge.        Left eye: No discharge.     Conjunctiva/sclera: Conjunctivae normal.  Neck:     Thyroid: No thyroid mass, thyromegaly or thyroid tenderness.     Trachea: Trachea and phonation normal. No tracheal deviation.  Cardiovascular:     Rate and Rhythm: Normal rate and regular rhythm.     Pulses: Normal pulses.          Radial pulses are 2+ on the right side and 2+ on the left side.       Posterior tibial pulses are 2+ on the right side and 2+ on the left side.     Heart sounds: Normal heart sounds. No murmur heard.  No friction rub. No gallop.   Pulmonary:     Effort: Pulmonary effort is normal. No respiratory distress.     Breath sounds: Normal breath sounds. No stridor. No wheezing, rhonchi or rales.  Chest:     Chest wall: No tenderness.  Abdominal:     General: Bowel sounds are normal. There is no distension.     Palpations: Abdomen is soft.     Tenderness: There is no abdominal tenderness. There is no guarding or rebound.  Musculoskeletal:     Cervical back: Neck supple.     Right lower leg: No edema.     Left lower leg: No edema.  Skin:    General: Skin is warm and dry.     Capillary Refill: Capillary refill takes less than 2 seconds.     Coloration: Skin is not pale.     Findings: No rash.  Neurological:     Mental Status: She is alert. Mental status is at baseline.     Motor: No abnormal muscle tone.     Gait: Gait  normal.  Psychiatric:        Mood and Affect: Mood normal.        Speech: Speech normal.        Behavior: Behavior normal.       Fall Risk: Fall  Risk  03/17/2020 09/01/2019 06/19/2019 03/13/2019  Falls in the past year? 0 0 0 0  Number falls in past yr: 0 0 0 0  Injury with Fall? 1 0 0 0  Follow up Falls evaluation completed Falls evaluation completed Falls evaluation completed Falls evaluation completed    Functional Status Survey: Is the patient deaf or have difficulty hearing?: No Does the patient have difficulty seeing, even when wearing glasses/contacts?: No Does the patient have difficulty concentrating, remembering, or making decisions?: No Does the patient have difficulty walking or climbing stairs?: No Does the patient have difficulty dressing or bathing?: No Does the patient have difficulty doing errands alone such as visiting a doctor's office or shopping?: No   Assessment & Plan:    CPE completed today   USPSTF grade A and B recommendations reviewed with patient; age-appropriate recommendations, preventive care, screening tests, etc discussed and encouraged; healthy living encouraged; see AVS for patient education given to patient   Discussed importance of 150 minutes of physical activity weekly, AHA exercise recommendations given to pt in AVS/handout   Discussed importance of healthy diet:  eating lean meats and proteins, avoiding trans fats and saturated fats, avoid simple sugars and excessive carbs in diet, eat 6 servings of fruit/vegetables daily and drink plenty of water and avoid sweet beverages.     Recommended pt to do annual eye exam and routine dental exams/cleanings   Depression, alcohol, fall screening completed as documented above and per flowsheets   Reviewed Health Maintenance: Health Maintenance  Topic Date Due   TETANUS/TDAP  Never done   INFLUENZA VACCINE  04/11/2020   MAMMOGRAM  05/28/2021   PAP SMEAR-Modifier  11/18/2021   COLONOSCOPY  11/16/2024   COVID-19 Vaccine  Completed   Hepatitis C Screening  Completed   HIV Screening  Completed     Immunizations: Immunization  History  Administered Date(s) Administered   Influenza Inj Mdck Quad With Preservative 07/06/2018   Influenza,inj,Quad PF,6+ Mos 06/07/2017, 06/27/2019   Influenza,inj,quad, With Preservative 07/15/2016   PFIZER SARS-COV-2 Vaccination 12/22/2019, 01/13/2020   1. Adult general medical exam Pt asked for forms to be completed for her employer - done at end of visit - Lipid panel - CBC w/Diff/Platelet - COMPLETE METABOLIC PANEL WITH GFR - Iron, TIBC and Ferritin Panel  2. Mixed hyperlipidemia Compliant with meds, no SE, no myalgias, fatigue or jaundice Due for FLP and recheck CMP Diet and exercise recommendations for HLD reviewed  - Lipid panel - COMPLETE METABOLIC PANEL WITH GFR  3. Essential hypertension Hx of HTN, briefly on HCTZ in the past, currently managed with diet and lifestyle BP at goal today, would like to keep130/80 or under - COMPLETE METABOLIC PANEL WITH GFR  4. Medication monitoring encounter - Lipid panel - CBC w/Diff/Platelet - COMPLETE METABOLIC PANEL WITH GFR - Iron, TIBC and Ferritin Panel  5. Family history of adult polycystic kidney disease Reviewed work-up with her prior PCP reviewed renal ultrasound results, lab results, I do not see any urine testing for ruling out hematuria or proteinuria patient denies any gross hematuria, no other symptoms per patient denies any pain, hematuria, change in urine output, edema. She does not know if there was a genetic work-up done and she  has not previously consulted with a nephrologist - COMPLETE METABOLIC PANEL WITH GFR  6. Iron deficiency anemia, unspecified iron deficiency anemia type Patient continues to take oral over-the-counter iron, she is postmenopausal, up-to-date on her colonoscopy, no signs or symptoms concerning for GI bleed, all of her last labs were normal, will recheck today, labs were last done about a year ago, if CBC and iron panel continues to be normal may transition her to a woman's multivitamin  and iron rich diet - CBC w/Diff/Platelet - Iron, TIBC and Ferritin Panel  7. Onychomycosis Patient requests refill on [place] she has used it off and on for short duration, seeing some benefit, discussed oral medications, will continue with topical and patient will follow-up if she desires PO tx - ciclopirox (PENLAC) 8 % solution; Apply topically at bedtime. Apply over nail and surrounding skin. Apply daily over previous coat. After seven (7) days, may remove with alcohol and continue cycle.  Dispense: 6.6 mL; Refill: 0  8. Overweight (BMI 25.0-29.9) Patient has gained 5 to 7 pounds over the last year, her job physical activity has significantly decreased mostly sedentary and sitting at a computer.  She overall has a healthy diet, she admits that she does late night snacking, she is trying to exercise 2-3 times a week. Encouraged her to try to get some more steps and walking in daily, taking breaks from her computer, walking out lunch order whenever she can to increase her physical activity and avoiding some of the sitdown work for long periods of time.  Encouraged her to observe and monitor her caloric intake, overall net calories would be important to know -I reviewed with her how to calculate her basal metabolic rate, estimate her amount of calories burned daily, encouraged her to check what she is eating and record it and see if day-to-day or over a week if she is net positive, neutral or negative.  If she would like to lose weight she should be net negative, if she would like to maintain her same weight she is should be net neutral.  The same time encouraged her to increase her physical activity and or intensity.     Delsa Grana, PA-C 03/17/20 1:15 PM  [facility]

## 2020-03-17 NOTE — Patient Instructions (Signed)
Preventive Care 40-56 Years Old, Female °Preventive care refers to visits with your health care provider and lifestyle choices that can promote health and wellness. This includes: °· A yearly physical exam. This may also be called an annual well check. °· Regular dental visits and eye exams. °· Immunizations. °· Screening for certain conditions. °· Healthy lifestyle choices, such as eating a healthy diet, getting regular exercise, not using drugs or products that contain nicotine and tobacco, and limiting alcohol use. °What can I expect for my preventive care visit? °Physical exam °Your health care provider will check your: °· Height and weight. This may be used to calculate body mass index (BMI), which tells if you are at a healthy weight. °· Heart rate and blood pressure. °· Skin for abnormal spots. °Counseling °Your health care provider may ask you questions about your: °· Alcohol, tobacco, and drug use. °· Emotional well-being. °· Home and relationship well-being. °· Sexual activity. °· Eating habits. °· Work and work environment. °· Method of birth control. °· Menstrual cycle. °· Pregnancy history. °What immunizations do I need? ° °Influenza (flu) vaccine °· This is recommended every year. °Tetanus, diphtheria, and pertussis (Tdap) vaccine °· You may need a Td booster every 10 years. °Varicella (chickenpox) vaccine °· You may need this if you have not been vaccinated. °Zoster (shingles) vaccine °· You may need this after age 60. °Measles, mumps, and rubella (MMR) vaccine °· You may need at least one dose of MMR if you were born in 1957 or later. You may also need a second dose. °Pneumococcal conjugate (PCV13) vaccine °· You may need this if you have certain conditions and were not previously vaccinated. °Pneumococcal polysaccharide (PPSV23) vaccine °· You may need one or two doses if you smoke cigarettes or if you have certain conditions. °Meningococcal conjugate (MenACWY) vaccine °· You may need this if you  have certain conditions. °Hepatitis A vaccine °· You may need this if you have certain conditions or if you travel or work in places where you may be exposed to hepatitis A. °Hepatitis B vaccine °· You may need this if you have certain conditions or if you travel or work in places where you may be exposed to hepatitis B. °Haemophilus influenzae type b (Hib) vaccine °· You may need this if you have certain conditions. °Human papillomavirus (HPV) vaccine °· If recommended by your health care provider, you may need three doses over 6 months. °You may receive vaccines as individual doses or as more than one vaccine together in one shot (combination vaccines). Talk with your health care provider about the risks and benefits of combination vaccines. °What tests do I need? °Blood tests °· Lipid and cholesterol levels. These may be checked every 5 years, or more frequently if you are over 50 years old. °· Hepatitis C test. °· Hepatitis B test. °Screening °· Lung cancer screening. You may have this screening every year starting at age 55 if you have a 30-pack-year history of smoking and currently smoke or have quit within the past 15 years. °· Colorectal cancer screening. All adults should have this screening starting at age 50 and continuing until age 75. Your health care provider may recommend screening at age 45 if you are at increased risk. You will have tests every 1-10 years, depending on your results and the type of screening test. °· Diabetes screening. This is done by checking your blood sugar (glucose) after you have not eaten for a while (fasting). You may have this   done every 1-3 years.  Mammogram. This may be done every 1-2 years. Talk with your health care provider about when you should start having regular mammograms. This may depend on whether you have a family history of breast cancer.  BRCA-related cancer screening. This may be done if you have a family history of breast, ovarian, tubal, or peritoneal  cancers.  Pelvic exam and Pap test. This may be done every 3 years starting at age 21. Starting at age 30, this may be done every 5 years if you have a Pap test in combination with an HPV test. Other tests  Sexually transmitted disease (STD) testing.  Bone density scan. This is done to screen for osteoporosis. You may have this scan if you are at high risk for osteoporosis. Follow these instructions at home: Eating and drinking  Eat a diet that includes fresh fruits and vegetables, whole grains, lean protein, and low-fat dairy.  Take vitamin and mineral supplements as recommended by your health care provider.  Do not drink alcohol if: ? Your health care provider tells you not to drink. ? You are pregnant, may be pregnant, or are planning to become pregnant.  If you drink alcohol: ? Limit how much you have to 0-1 drink a day. ? Be aware of how much alcohol is in your drink. In the [place] one drink equals one 12 oz bottle of beer (355 mL), one 5 oz glass of wine (148 mL), or one 1 oz glass of hard liquor (44 mL). Lifestyle  Take daily care of your teeth and gums.  Stay active. Exercise for at least 30 minutes on 5 or more days each week.  Do not use any products that contain nicotine or tobacco, such as cigarettes, e-cigarettes, and chewing tobacco. If you need help quitting, ask your health care provider.  If you are sexually active, practice safe sex. Use a condom or other form of birth control (contraception) in order to prevent pregnancy and STIs (sexually transmitted infections).  If told by your health care provider, take low-dose aspirin daily starting at age 50. What's next?  Visit your health care provider once a year for a well check visit.  Ask your health care provider how often you should have your eyes and teeth checked.  Stay up to date on all vaccines. This information is not intended to replace advice given to you by your health care provider. Make sure you  discuss any questions you have with your health care provider. Document Revised: 05/09/2018 Document Reviewed: 08/2, Female °Preventive care refers to visits with your health care provider and lifestyle choices that can promote health and wellness. This includes: °· A yearly physical exam. This may also be called an annual well check. °· Regular dental visits and eye exams. °· Immunizations. °· Screening for certain conditions. °· Healthy lifestyle choices, such as eating a healthy diet, getting regular exercise, not using drugs or products that contain nicotine and tobacco, and limiting alcohol use. °What can I expect for my preventive care visit? °Physical exam °Your health care provider will check your: °· Height and weight. This may be used to calculate body mass index (BMI), which tells if you are at a healthy weight. °· Heart rate and blood pressure. °· Skin for abnormal spots. °Counseling °Your health care provider may ask you questions about your: °· Alcohol, tobacco, and drug use. °· Emotional well-being. °· Home and relationship well-being. °· Sexual activity. °· Eating habits. °· Work and work environment. °· Method of birth control. °· Menstrual cycle. °· Pregnancy history. °What immunizations do I need? ° °Influenza (flu) vaccine °· This is recommended every year. °Tetanus, diphtheria, and pertussis (Tdap) vaccine °· You may need a Td booster every 10 years. °Varicella (chickenpox) vaccine °· You may need this if you have not been vaccinated. °Zoster (shingles) vaccine °· You may need this after age 60. °Measles, mumps, and rubella (MMR) vaccine °· You may need at least one dose of MMR if you were born in 1957 or later. You may also need a second dose. °Pneumococcal conjugate (PCV13) vaccine °· You may need this if you have certain conditions and were not previously vaccinated. °Pneumococcal polysaccharide (PPSV23) vaccine °· You may need one or two doses if you smoke cigarettes or if you have certain conditions. °Meningococcal conjugate (MenACWY) vaccine °· You may need this if you  have certain conditions. °Hepatitis A vaccine °· You may need this if you have certain conditions or if you travel or work in places where you may be exposed to hepatitis A. °Hepatitis B vaccine °· You may need this if you have certain conditions or if you travel or work in places where you may be exposed to hepatitis B. °Haemophilus influenzae type b (Hib) vaccine °· You may need this if you have certain conditions. °Human papillomavirus (HPV) vaccine °· If recommended by your health care provider, you may need three doses over 6 months. °You may receive vaccines as individual doses or as more than one vaccine together in one shot (combination vaccines). Talk with your health care provider about the risks and benefits of combination vaccines. °What tests do I need? °Blood tests °· Lipid and cholesterol levels. These may be checked every 5 years, or more frequently if you are over 50 years old. °· Hepatitis C test. °· Hepatitis B test. °Screening °· Lung cancer screening. You may have this screening every year starting at age 55 if you have a 30-pack-year history of smoking and currently smoke or have quit within the past 15 years. °· Colorectal cancer screening. All adults should have this screening starting at age 50 and continuing until age 75. Your health care provider may recommend screening at age 45 if you are at increased risk. You will have tests every 1-10 years, depending on your results and the type of screening test. °· Diabetes screening. This is done by checking your blood sugar (glucose) after you have not eaten for a while (fasting). You may have this   done every 1-3 years.  Mammogram. This may be done every 1-2 years. Talk with your health care provider about when you should start having regular mammograms. This may depend on whether you have a family history of breast cancer.  BRCA-related cancer screening. This may be done if you have a family history of breast, ovarian, tubal, or peritoneal  cancers.  Pelvic exam and Pap test. This may be done every 3 years starting at age 21. Starting at age 30, this may be done every 5 years if you have a Pap test in combination with an HPV test. Other tests  Sexually transmitted disease (STD) testing.  Bone density scan. This is done to screen for osteoporosis. You may have this scan if you are at high risk for osteoporosis. Follow these instructions at home: Eating and drinking  Eat a diet that includes fresh fruits and vegetables, whole grains, lean protein, and low-fat dairy.  Take vitamin and mineral supplements as recommended by your health care provider.  Do not drink alcohol if: ? Your health care provider tells you not to drink. ? You are pregnant, may be pregnant, or are planning to become pregnant.  If you drink alcohol: ? Limit how much you have to 0-1 drink a day. ? Be aware of how much alcohol is in your drink. In the [place] one drink equals one 12 oz bottle of beer (355 mL), one 5 oz glass of wine (148 mL), or one 1 oz glass of hard liquor (44 mL). Lifestyle  Take daily care of your teeth and gums.  Stay active. Exercise for at least 30 minutes on 5 or more days each week.  Do not use any products that contain nicotine or tobacco, such as cigarettes, e-cigarettes, and chewing tobacco. If you need help quitting, ask your health care provider.  If you are sexually active, practice safe sex. Use a condom or other form of birth control (contraception) in order to prevent pregnancy and STIs (sexually transmitted infections).  If told by your health care provider, take low-dose aspirin daily starting at age 50. What's next?  Visit your health care provider once a year for a well check visit.  Ask your health care provider how often you should have your eyes and teeth checked.  Stay up to date on all vaccines. This information is not intended to replace advice given to you by your health care provider. Make sure you  discuss any questions you have with your health care provider. Document Revised: 05/09/2018 Document Reviewed: 05/09/2018 Elsevier Patient Education  2020 [employer]   Preventing Osteoporosis, Adult Osteoporosis is a condition that causes the bones to lose density. This means that the bones become thinner, and the normal spaces in bone tissue become larger. Low bone density can make the bones weak and cause them to break more easily. Osteoporosis cannot always be prevented, but you can take steps to lower your risk of developing this condition. How can this condition affect me? If you develop osteoporosis, you will be more likely to break bones in your wrist, spine, or hip. Even a minor accident or injury can be enough to break weak bones. The bones will also be slower to heal. Osteoporosis can cause other problems as well, such as a stooped posture or trouble with movement. Osteoporosis can occur with aging. As you get older, you may lose bone tissue more quickly, or it may be replaced more slowly. Osteoporosis is more likely to develop if you have   poor nutrition or do not get enough calcium or vitamin D. Other lifestyle factors can also play a role. By eating a well-balanced diet and making lifestyle changes, you can help keep your bones strong and healthy, lowering your chances of developing osteoporosis. What can increase my risk? The following factors may make you more likely to develop osteoporosis:  Having a family history of the condition.  Having poor nutrition or not getting enough calcium or vitamin D.  Using certain medicines, such as steroid medicines or antiseizure medicines.  Being any of the following: ? 50 years of age or older. ? Female. ? A woman who has gone through menopause (is postmenopausal). ? White (Caucasian) or of Asian descent.  Smoking or having a history of smoking.  Not being physically active (being sedentary).  Having a small body frame. What  actions can I take to prevent this?  Get enough calcium   Make sure you get enough calcium every day. Calcium is the most important mineral for bone health. Most people can get enough calcium from their diet, but supplements may be recommended for people who are at risk for osteoporosis. Follow these guidelines: ? If you are age 50 or younger, aim to get 1,000 mg of calcium every day. ? If you are older than age 50, aim to get 1,200 mg of calcium every day.  Good sources of calcium include: ? Dairy products, such as low-fat or nonfat milk, cheese, and yogurt. ? Dark green leafy vegetables, such as bok choy and broccoli. ? Foods that have had calcium added to them (calcium-fortified foods), such as orange juice, cereal, bread, soy beverages, and tofu products. ? Nuts, such as almonds.  Check nutrition labels to see how much calcium is in a food or drink. Get enough vitamin D  Try to get enough vitamin D every day. Vitamin D is the most essential vitamin for bone health. It helps the body absorb calcium. Follow these guidelines for how much vitamin D to get from food: ? If you are age 70 or younger, aim to get at least 600 international units (IU) every day. Your health care provider may suggest more. ? If you are older than age 70, aim to get at least 800 international units every day. Your health care provider may suggest more.  Good sources of vitamin D in your diet include: ? Egg yolks. ? Oily fish, such as salmon, sardines, and tuna. ? Milk and cereal fortified with vitamin D.  Your body also makes vitamin D when you are out in the sun. Exposing the bare skin on your face, arms, legs, or back to the sun for no more than 30 minutes a day, 2 times a week is more than enough. Beyond that, make sure you use sunblock to protect your skin from sunburn, which increases your risk for skin cancer. Exercise  Stay active and get exercise every day.  Ask your health care provider what types of  exercise are best for you. Weight-bearing and strength-building activities are important for building and maintaining healthy bones. Some examples of these types of activities include: ? Walking and hiking. ? Jogging and running. ? Dancing. ? Gym exercises. ? Lifting weights. ? Tennis and racquetball. ? Climbing stairs. ? Aerobics. Make other lifestyle changes  Do not use any products that contain nicotine or tobacco, such as cigarettes, e-cigarettes, and chewing tobacco. If you need help quitting, ask your health care provider.  Lose weight if you are overweight.    If you drink alcohol: ? Limit how much you use to:  0-1 drink a day for nonpregnant women.  0-2 drinks a day for men. ? Be aware of how much alcohol is in your drink. In the [place] one drink equals one 12 oz bottle of beer (355 mL), one 5 oz glass of wine (148 mL), or one 1 oz glass of hard liquor (44 mL). Where to find support If you need help making changes to prevent osteoporosis, talk with your health care provider. You can ask for a referral to a diet and nutrition specialist (dietitian) and a physical therapist. Where to find more information Learn more about osteoporosis from:  NIH Osteoporosis and Related Bone Diseases National Resource Center: www.bones.nih.gov  [place] Office on Women's Health: www.womenshealth.gov  National Osteoporosis Foundation: www.nof.org Summary  Osteoporosis is a condition that causes weak bones that are more likely to break.  Eat a healthy diet, making sure you get enough calcium and vitamin D, and stay active by getting regular exercise to help prevent osteoporosis.  Other ways to reduce your risk of osteoporosis include maintaining a healthy weight and avoiding alcohol and products that contain nicotine or tobacco. This information is not intended to replace advice given to you by your health care provider. Make sure you discuss any questions you have with your health care  provider. Document Revised: 03/28/2019 Document Reviewed: 03/28/2019 Elsevier Patient Education  2020 [employer]  

## 2020-03-19 ENCOUNTER — Encounter: Payer: Self-pay | Admitting: Family Medicine

## 2020-03-19 ENCOUNTER — Telehealth: Payer: Self-pay

## 2020-03-19 NOTE — Telephone Encounter (Signed)
Left detailed vm °

## 2020-03-19 NOTE — Telephone Encounter (Signed)
PA denied on ciclopirox

## 2020-04-29 ENCOUNTER — Other Ambulatory Visit: Payer: Self-pay

## 2020-04-29 ENCOUNTER — Ambulatory Visit (INDEPENDENT_AMBULATORY_CARE_PROVIDER_SITE_OTHER): Payer: 59 | Admitting: Obstetrics and Gynecology

## 2020-04-29 ENCOUNTER — Encounter: Payer: Self-pay | Admitting: Obstetrics and Gynecology

## 2020-04-29 VITALS — BP 148/87 | HR 79 | Ht 63.0 in | Wt 157.7 lb

## 2020-04-29 DIAGNOSIS — Z01419 Encounter for gynecological examination (general) (routine) without abnormal findings: Secondary | ICD-10-CM

## 2020-04-29 DIAGNOSIS — E785 Hyperlipidemia, unspecified: Secondary | ICD-10-CM | POA: Diagnosis not present

## 2020-04-29 DIAGNOSIS — N951 Menopausal and female climacteric states: Secondary | ICD-10-CM

## 2020-04-29 DIAGNOSIS — Z1231 Encounter for screening mammogram for malignant neoplasm of breast: Secondary | ICD-10-CM | POA: Diagnosis not present

## 2020-04-29 DIAGNOSIS — E663 Overweight: Secondary | ICD-10-CM

## 2020-04-29 NOTE — Progress Notes (Signed)
Pt present for annual exam. Pt state that she was doing well and denies any gyn issues at this time.

## 2020-04-29 NOTE — Patient Instructions (Signed)
Preventive Care 40-56 Years Old, Female Preventive care refers to visits with your health care provider and lifestyle choices that can promote health and wellness. This includes:  A yearly physical exam. This may also be called an annual well check.  Regular dental visits and eye exams.  Immunizations.  Screening for certain conditions.  Healthy lifestyle choices, such as eating a healthy diet, getting regular exercise, not using drugs or products that contain nicotine and tobacco, and limiting alcohol use. What can I expect for my preventive care visit? Physical exam Your health care provider will check your:  Height and weight. This may be used to calculate body mass index (BMI), which tells if you are at a healthy weight.  Heart rate and blood pressure.  Skin for abnormal spots. Counseling Your health care provider may ask you questions about your:  Alcohol, tobacco, and drug use.  Emotional well-being.  Home and relationship well-being.  Sexual activity.  Eating habits.  Work and work environment.  Method of birth control.  Menstrual cycle.  Pregnancy history. What immunizations do I need?  Influenza (flu) vaccine  This is recommended every year. Tetanus, diphtheria, and pertussis (Tdap) vaccine  You may need a Td booster every 10 years. Varicella (chickenpox) vaccine  You may need this if you have not been vaccinated. Zoster (shingles) vaccine  You may need this after age 60. Measles, mumps, and rubella (MMR) vaccine  You may need at least one dose of MMR if you were born in 1957 or later. You may also need a second dose. Pneumococcal conjugate (PCV13) vaccine  You may need this if you have certain conditions and were not previously vaccinated. Pneumococcal polysaccharide (PPSV23) vaccine  You may need one or two doses if you smoke cigarettes or if you have certain conditions. Meningococcal conjugate (MenACWY) vaccine  You may need this if you  have certain conditions. Hepatitis A vaccine  You may need this if you have certain conditions or if you travel or work in places where you may be exposed to hepatitis A. Hepatitis B vaccine  You may need this if you have certain conditions or if you travel or work in places where you may be exposed to hepatitis B. Haemophilus influenzae type b (Hib) vaccine  You may need this if you have certain conditions. Human papillomavirus (HPV) vaccine  If recommended by your health care provider, you may need three doses over 6 months. You may receive vaccines as individual doses or as more than one vaccine together in one shot (combination vaccines). Talk with your health care provider about the risks and benefits of combination vaccines. What tests do I need? Blood tests  Lipid and cholesterol levels. These may be checked every 5 years, or more frequently if you are over 50 years old.  Hepatitis C test.  Hepatitis B test. Screening  Lung cancer screening. You may have this screening every year starting at age 55 if you have a 30-pack-year history of smoking and currently smoke or have quit within the past 15 years.  Colorectal cancer screening. All adults should have this screening starting at age 50 and continuing until age 75. Your health care provider may recommend screening at age 45 if you are at increased risk. You will have tests every 1-10 years, depending on your results and the type of screening test.  Diabetes screening. This is done by checking your blood sugar (glucose) after you have not eaten for a while (fasting). You may have this   done every 1-3 years.  Mammogram. This may be done every 1-2 years. Talk with your health care provider about when you should start having regular mammograms. This may depend on whether you have a family history of breast cancer.  BRCA-related cancer screening. This may be done if you have a family history of breast, ovarian, tubal, or peritoneal  cancers.  Pelvic exam and Pap test. This may be done every 3 years starting at age 60. Starting at age 7, this may be done every 5 years if you have a Pap test in combination with an HPV test. Other tests  Sexually transmitted disease (STD) testing.  Bone density scan. This is done to screen for osteoporosis. You may have this scan if you are at high risk for osteoporosis. Follow these instructions at home: Eating and drinking  Eat a diet that includes fresh fruits and vegetables, whole grains, lean protein, and low-fat dairy.  Take vitamin and mineral supplements as recommended by your health care provider.  Do not drink alcohol if: ? Your health care provider tells you not to drink. ? You are pregnant, may be pregnant, or are planning to become pregnant.  If you drink alcohol: ? Limit how much you have to 0-1 drink a day. ? Be aware of how much alcohol is in your drink. In the [place] one drink equals one 12 oz bottle of beer (355 mL), one 5 oz glass of wine (148 mL), or one 1 oz glass of hard liquor (44 mL). Lifestyle  Take daily care of your teeth and gums.  Stay active. Exercise for at least 30 minutes on 5 or more days each week.  Do not use any products that contain nicotine or tobacco, such as cigarettes, e-cigarettes, and chewing tobacco. If you need help quitting, ask your health care provider.  If you are sexually active, practice safe sex. Use a condom or other form of birth control (contraception) in order to prevent pregnancy and STIs (sexually transmitted infections).  If told by your health care provider, take low-dose aspirin daily starting at age 48. What's next?  Visit your health care provider once a year for a well check visit.  Ask your health care provider how often you should have your eyes and teeth checked.  Stay up to date on all vaccines. This information is not intended to replace advice given to you by your health care provider. Make sure you  discuss any questions you have with your health care provider. Document Revised: 05/09/2018 Document Reviewed: 05/09/2018 Elsevier Patient Education  2020 [place] Breast self-awareness is knowing how your breasts look and feel. Doing breast self-awareness is important. It allows you to catch a breast problem early while it is still small and can be treated. All women should do breast self-awareness, including women who have had breast implants. Tell your doctor if you notice a change in your breasts. What you need:  A mirror.  A well-lit room. How to do a breast self-exam A breast self-exam is one way to learn what is normal for your breasts and to check for changes. To do a breast self-exam: Look for changes  1. Take off all the clothes above your waist. 2. Stand in front of a mirror in a room with good lighting. 3. Put your hands on your hips. 4. Push your hands down. 5. Look at your breasts and nipples in the mirror to see if one breast or nipple looks different from the  other. Check to see if: ? The shape of one breast is different. ? The size of one breast is different. ? There are wrinkles, dips, and bumps in one breast and not the other. 6. Look at each breast for changes in the skin, such as: ? Redness. ? Scaly areas. 7. Look for changes in your nipples, such as: ? Liquid around the nipples. ? Bleeding. ? Dimpling. ? Redness. ? A change in where the nipples are. Feel for changes  1. Lie on your back on the floor. 2. Feel each breast. To do this, follow these steps: ? Pick a breast to feel. ? Put the arm closest to that breast above your head. ? Use your other arm to feel the nipple area of your breast. Feel the area with the pads of your three middle fingers by making small circles with your fingers. For the first circle, press lightly. For the second circle, press harder. For the third circle, press even harder. ? Keep making circles with  your fingers at the different pressures as you move down your breast. Stop when you feel your ribs. ? Move your fingers a little toward the center of your body. ? Start making circles with your fingers again, this time going up until you reach your collarbone. ? Keep making up-and-down circles until you reach your armpit. Remember to keep using the three pressures. ? Feel the other breast in the same way. 3. Sit or stand in the tub or shower. 4. With soapy water on your skin, feel each breast the same way you did in step 2 when you were lying on the floor. Write down what you find Writing down what you find can help you remember what to tell your doctor. Write down:  What is normal for each breast.  Any changes you find in each breast, including: ? The kind of changes you find. ? Whether you have pain. ? Size and location of any lumps.  When you last had your menstrual period. General tips  Check your breasts every month.  If you are breastfeeding, the best time to check your breasts is after you feed your baby or after you use a breast pump.  If you get menstrual periods, the best time to check your breasts is 5-7 days after your menstrual period is over.  With time, you will become comfortable with the self-exam, and you will begin to know if there are changes in your breasts. Contact a doctor if you:  See a change in the shape or size of your breasts or nipples.  See a change in the skin of your breast or nipples, such as red or scaly skin.  Have fluid coming from your nipples that is not normal.  Find a lump or thick area that was not there before.  Have pain in your breasts.  Have any concerns about your breast health. Summary  Breast self-awareness includes looking for changes in your breasts, as well as feeling for changes within your breasts.  Breast self-awareness should be done in front of a mirror in a well-lit room.  You should check your breasts every month.  If you get menstrual periods, the best time to check your breasts is 5-7 days after your menstrual period is over.  Let your doctor know of any changes you see in your breasts, including changes in size, changes on the skin, pain or tenderness, or fluid from your nipples that is not normal. This information is not  intended to replace advice given to you by your health care provider. Make sure you discuss any questions you have with your health care provider. Document Revised: 04/16/2018 Document Reviewed: 04/16/2018 Elsevier Patient Education  [place].

## 2020-04-29 NOTE — Progress Notes (Signed)
ANNUAL PREVENTATIVE CARE GYNECOLOGY  ENCOUNTER NOTE  Subjective:       Gail Harper is a 54 y.o. [phone] menopausal female here for a routine annual gynecologic exam. The patient is not sexually active (widow). The patient is not taking hormone replacement therapy and she denies post-menopausal vaginal bleeding. The patient wears seatbelts: yes . The patient participates in regular exercise: yes. (walking a few times weekly, not as much as before. Recently got an apple watch which has been motivating her to get up and move more during the day). Has the patient ever been transfused or tattooed?: no. The patient reports that there is not domestic violence in her life.  Current complaints: 1.  Notes hot flushes once maximum per day, becoming less frequent. Lasts for a few seconds then subsides.  States that it is manageable and declines medical management.    Gynecologic History Patient's last menstrual period was 10/17/2016. Contraception: post menopausal status Last Pap: 11/19/2018. Results were: normal Last mammogram: 05/29/2019. Results were: normal Last Colonoscopy: ~5 years ago.  Was normal per patient except 2 benign polyps. Recommended q 10 year follow up.     Obstetric History OB History  Gravida Para Term Preterm AB Living  2 2 2     2   SAB TAB Ectopic Multiple Live Births          2    # Outcome Date GA Lbr Len/2nd Weight Sex Delivery Anes PTL Lv  2 Term 1989   7 lb 1.8 oz (3.225 kg) F Vag-Spont   LIV  1 Term 1985   6 lb 6.4 oz (2.903 kg) F Vag-Spont   LIV    Past Medical History:  Diagnosis Date  . Anemia   . High cholesterol     Family History  Problem Relation Age of Onset  . Heart disease Sister   . Kidney disease Sister   . Polycystic kidney disease Sister   . Stroke Mother   . Kidney disease Mother   . Polycystic kidney disease Mother   . Cancer Father   . Kidney disease Brother   . Polycystic kidney disease Brother   . Stroke Sister 69  .  Polycystic kidney disease Sister   . Diabetes Neg Hx   . Breast cancer Neg Hx     Past Surgical History:  Procedure Laterality Date  . 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 Marital status: Widowed    Spouse name: Not on file  . Number of children: 2  . Years of education: Not on file  . Highest education level: Not on file  Occupational History  . Not on file  Tobacco Use  . Smoking status: Never Smoker  . Smokeless tobacco: Never Used  Vaping Use  . Vaping Use: Never used  Substance and Sexual Activity  . Alcohol use: No  . Drug use: No  . Sexual activity: Not Currently    Partners: Male  Other Topics Concern  . Not on file  Social History Narrative  . Not on file   Social Determinants of Health   Financial Resource Strain:   . Difficulty of Paying Living Expenses: Not on file  Food Insecurity:   . Worried About 2001 in the Last Year: Not on file  . Ran Out of Food in the Last Year: Not on file  Transportation Needs:   . Lack of Transportation (  Medical): Not on file  . Lack of Transportation (Non-Medical): Not on file  Physical Activity:   . Days of Exercise per Week: Not on file  . Minutes of Exercise per Session: Not on file  Stress:   . Feeling of Stress : Not on file  Social Connections:   . Frequency of Communication with Friends and Family: Not on file  . Frequency of Social Gatherings with Friends and Family: Not on file  . Attends Religious Services: Not on file  . Active Member of Clubs or Organizations: Not on file  . Attends Banker Meetings: Not on file  . Marital Status: Not on file  Intimate Partner Violence:   . Fear of Current or Ex-Partner: Not on file  . Emotionally Abused: Not on file  . Physically Abused: Not on file  . Sexually Abused: Not on file    Current Outpatient Medications on File Prior to Visit  Medication Sig Dispense Refill  . atorvastatin  (LIPITOR) 10 MG tablet Take 1 tablet (10 mg total) by mouth at bedtime. 90 tablet 3  . ciclopirox (PENLAC) 8 % solution Apply topically at bedtime. Apply over nail and surrounding skin. Apply daily over previous coat. After seven (7) days, may remove with alcohol and continue cycle. 6.6 mL 0  . diclofenac (VOLTAREN) 75 MG EC tablet Take 1 tablet (75 mg total) by mouth 2 (two) times daily as needed for moderate pain. 14 tablet 0  . ferrous sulfate 325 (65 FE) MG tablet Take 325 mg by mouth daily with breakfast.    . terbinafine (LAMISIL AT) 1 % cream Apply 1 application topically 2 (two) times daily. 42 g 0  . tiZANidine (ZANAFLEX) 4 MG tablet Take 1 tablet (4 mg total) by mouth every 8 (eight) hours as needed for muscle spasms. 30 tablet 0  . triamcinolone cream (KENALOG) 0.1 % Apply 1 application topically 2 (two) times daily. 30 g 0   No current facility-administered medications on file prior to visit.    No Known Allergies    Review of Systems ROS Review of Systems - General ROS: negative for - chills, fatigue, fever, night sweats, weight gain or weight loss.  Positive for hot flushes (see HPI) Psychological ROS: negative for - anxiety, decreased libido, depression, mood swings, physical abuse or sexual abuse Ophthalmic ROS: negative for - blurry vision, eye pain or loss of vision ENT ROS: negative for - headaches, hearing change, visual changes or vocal changes Allergy and Immunology ROS: negative for - hives, itchy/watery eyes or seasonal allergies Hematological and Lymphatic ROS: negative for - bleeding problems, bruising, swollen lymph nodes or weight loss Endocrine ROS: negative for - galactorrhea, hair pattern changes, hot flashes, malaise/lethargy, mood swings, palpitations, polydipsia/polyuria, skin changes, temperature intolerance or unexpected weight changes Breast ROS: negative for - new or changing breast lumps or nipple discharge Respiratory ROS: negative for - cough or  shortness of breath Cardiovascular ROS: negative for - chest pain, irregular heartbeat, palpitations or shortness of breath Gastrointestinal ROS: no abdominal pain, change in bowel habits, or black or bloody stools Genito-Urinary ROS: no dysuria, trouble voiding, or hematuria Musculoskeletal ROS: negative for - joint pain or joint stiffness Neurological ROS: negative for - bowel and bladder control changes Dermatological ROS: negative for rash and skin lesion changes   Objective:   BP (!) 148/87   Pulse 79   Ht 5\' 3"  (1.6 m)   Wt 157 lb 11.2 oz (71.5 kg)   LMP 10/17/2016  BMI 27.94 kg/m  CONSTITUTIONAL: Well-developed, well-nourished female in no acute distress. Overweight PSYCHIATRIC: Normal mood and affect. Normal behavior. Normal judgment and thought content. NEUROLGIC: Alert and oriented to person, place, and time. Normal muscle tone coordination. No cranial nerve deficit noted. HENT:  Normocephalic, atraumatic, External right and left ear normal. Oropharynx is clear and moist EYES: Conjunctivae and EOM are normal. Pupils are equal, round, and reactive to light. No scleral icterus.  NECK: Normal range of motion, supple, no masses.  Normal thyroid.  SKIN: Skin is warm and dry. No rash noted. Not diaphoretic. No erythema. No pallor. CARDIOVASCULAR: Normal heart rate noted, regular rhythm, no murmur. RESPIRATORY: Clear to auscultation bilaterally. Effort and breath sounds normal, no problems with respiration noted. BREASTS: Symmetric in size. No masses, skin changes, nipple drainage, or lymphadenopathy. ABDOMEN: Soft, normal bowel sounds, no distention noted.  No tenderness, rebound or guarding.  BLADDER: Normal PELVIC:  Bladder no bladder distension noted  Urethra: normal appearing urethra with no masses, tenderness or lesions  Vulva: normal appearing vulva with no masses, tenderness or lesions  Vagina: normal appearing vagina with normal color and discharge, no lesions  Cervix:  normal appearing cervix without discharge or lesions  Uterus: uterus is normal size, shape, consistency and nontender  Adnexa: normal adnexa in size, nontender and no masses  RV: External Exam NormaI, No Rectal Masses and Normal Sphincter tone  MUSCULOSKELETAL: Normal range of motion. No tenderness.  No cyanosis, clubbing, or edema.  2+ distal pulses. LYMPHATIC: No Axillary, Supraclavicular, or Inguinal Adenopathy.   Labs: Lab Results  Component Value Date   WBC 8.7 03/17/2020   HGB 12.3 03/17/2020   HCT 38.3 03/17/2020   MCV 82.9 03/17/2020   PLT 301 03/17/2020    Lab Results  Component Value Date   CREATININE 0.62 03/17/2020   BUN 11 03/17/2020   NA 141 03/17/2020   K 4.1 03/17/2020   CL 104 03/17/2020   CO2 29 03/17/2020    Lab Results  Component Value Date   ALT 18 03/17/2020   AST 18 03/17/2020   BILITOT 0.5 03/17/2020    Lab Results  Component Value Date   CHOL 200 (H) 03/17/2020   HDL 48 (L) 03/17/2020   LDLCALC 120 (H) 03/17/2020   TRIG 202 (H) 03/17/2020   CHOLHDL 4.2 03/17/2020    No results found for: TSH  No results found for: HGBA1C   Assessment:   1. Encounter for well woman exam with routine gynecological exam   2. Breast cancer screening by mammogram   3. Hyperlipidemia, unspecified hyperlipidemia type   4. Menopausal hot flushes   5. Overweight (BMI 25.0-29.9)    Plan:  Pap: Pap smear up to date, next due in 2023. Mammogram: Ordered.  Stool Guaiac Testing:  Not Indicated. Colonoscopy up to date.  Labs: All labs up to date (recently completed from PCP in July)  Contraception: post menopausal status.  Routine preventative health maintenance measures emphasized: Exercise/Diet/Weight control, Tobacco Warnings, Alcohol/Substance use risks and Stress Management Menopausal vasomotor symptoms, mild. Patient declines medical management at this time, notes symptoms are manageable.  Covid vaccination status: 2 dose series completed Proofreader).  See  Epic for documentation.  Return to Clinic - 1 Year   Hildred Laser, MD [facility]

## 2020-06-02 ENCOUNTER — Ambulatory Visit
Admission: RE | Admit: 2020-06-02 | Discharge: 2020-06-02 | Disposition: A | Discharge status: Home / Self Care | Payer: 59 | Source: Ambulatory Visit | Attending: Obstetrics and Gynecology | Admitting: Obstetrics and Gynecology

## 2020-06-02 ENCOUNTER — Other Ambulatory Visit: Payer: Self-pay

## 2020-06-02 DIAGNOSIS — Z1231 Encounter for screening mammogram for malignant neoplasm of breast: Secondary | ICD-10-CM

## 2020-07-13 ENCOUNTER — Other Ambulatory Visit: Payer: Self-pay

## 2020-07-13 ENCOUNTER — Encounter: Payer: Self-pay | Admitting: Obstetrics and Gynecology

## 2020-07-13 ENCOUNTER — Ambulatory Visit (INDEPENDENT_AMBULATORY_CARE_PROVIDER_SITE_OTHER): Payer: 59 | Admitting: Obstetrics and Gynecology

## 2020-07-13 VITALS — BP 138/76 | HR 82 | Ht 63.0 in | Wt 156.3 lb

## 2020-07-13 DIAGNOSIS — R1032 Left lower quadrant pain: Secondary | ICD-10-CM

## 2020-07-13 DIAGNOSIS — N951 Menopausal and female climacteric states: Secondary | ICD-10-CM | POA: Diagnosis not present

## 2020-07-13 NOTE — Progress Notes (Signed)
Pt present due to having left lower abd pain since 06/24/2020. Pt c/o of hot flashes, night sweats and lower abd pain.

## 2020-07-13 NOTE — Progress Notes (Signed)
    GYNECOLOGY PROGRESS NOTE  Subjective:    Patient ID: Gail Harper, female    DOB: 01-10-1966, 54 y.o.   MRN: [id number]  HPI  Patient is a 54 y.o. [id number] female who presents for complaints of dull intermittent pain that started 1 month ago in left lower quadrant. Notes regular bowel movements. Pain is not bad enough to have to take anything.  Pain usually is very short-lived, lasting . Nothing makes pain better or worse. Denies vaginal discharge.   Also notes issues with hot flushes. These have been ongoing for "some time now", but have been a little worse lately.    The following portions of the patient's history were reviewed and updated as appropriate: allergies, current medications, past family history, past medical history, past social history, past surgical history and problem list.   Review of Systems Pertinent items noted in HPI and remainder of comprehensive ROS otherwise negative.   Objective:   Blood pressure 138/76, pulse 82, height 5\' 3"  (1.6 m), weight 156 lb 4.8 oz (70.9 kg), last menstrual period 10/17/2016. General appearance: alert and no distress Abdomen: soft, non-tender; bowel sounds normal; no masses,  no organomegaly Pelvic: deferred   Assessment:   LLQ pain Menopausal hot flushes   Plan:   1. LLQ pain - unclear cause, but on further discussion, appears to be musculoskeletal.  Patient notes often carrying heavy bags on her left shoulder (as she is often noting her left shoulder hurting as well at times. Advised on alternating sides.  If no relief, can proceed with further workup.  2. Menopausal hot flushes - discussed options for patient, including lifestyle interventions such as wearing light clothing, remaining in cool environments, having fan/air conditioner in the room, avoiding hot beverages etc.  Briefly discussed using hormone therapy including risks and benefits. Also discussed other medical options such as Paxil, Effexor, Brisdelle, Clonidine,   or Neurontin.  Also discussed alternative therapies such as herbal remedies but cautioned that most of the products contained phytoestrogens (plant estrogens) in unregulated amounts which can have the same effects on the body as the pharmaceutical estrogen preparations.  Patient unsure, but desires to think over options, moreso interested in herbal supplements. Given list. Follow up if symptoms worsen or do not improve.    12/15/2016, MD [facility]

## 2020-09-15 ENCOUNTER — Other Ambulatory Visit: Payer: Self-pay

## 2020-09-15 ENCOUNTER — Ambulatory Visit
Admission: EM | Admit: 2020-09-15 | Discharge: 2020-09-15 | Disposition: A | Discharge status: Home / Self Care | Payer: 59 | Attending: Emergency Medicine | Admitting: Emergency Medicine

## 2020-09-15 ENCOUNTER — Encounter: Payer: Self-pay | Admitting: Emergency Medicine

## 2020-09-15 DIAGNOSIS — U071 COVID-19: Secondary | ICD-10-CM | POA: Insufficient documentation

## 2020-09-15 DIAGNOSIS — J069 Acute upper respiratory infection, unspecified: Secondary | ICD-10-CM

## 2020-09-15 MED ORDER — BENZONATATE 100 MG PO CAPS: 200.0000 mg | ORAL_CAPSULE | Freq: Three times a day (TID) | ORAL | 0 refills | Status: DC

## 2020-09-15 MED ORDER — PROMETHAZINE-DM 6.25-15 MG/5ML PO SYRP: 5.0000 mL | ORAL_SOLUTION | Freq: Four times a day (QID) | ORAL | 0 refills | Status: DC | PRN

## 2020-09-15 NOTE — ED Triage Notes (Signed)
Pt c/o scratchy throat, cough, chills. Started about 3 days ago. Denies fever.

## 2020-09-15 NOTE — ED Provider Notes (Signed)
[facility]    CSN: [id number] Arrival date & time: 09/15/20  1458      History   Chief Complaint Chief Complaint  Patient presents with  . Cough    [phone]    HPI Gail Harper is a 55 y.o. female.   HPI   55 year old female here for evaluation of scratchy throat cough, and chills going on for last 3 days.  Patient also reports that she has had a slight runny nose.  Patient denies fever, ear pain or pressure, headache, shortness of breath or wheezing, GI complaints, body aches, or changes to her sense of taste or smell.  Patient has been fully vaccinated against Covid and received her booster shot 6 days ago.  Patient's daughter did recently test positive for Covid and she was around her a few days before her symptoms started.  Past Medical History:  Diagnosis Date  . Anemia   . High cholesterol     Patient Active Problem List   Diagnosis Date Noted  . Iron deficiency anemia 03/17/2020  . Family history of adult polycystic kidney disease 03/17/2020  . Onychomycosis 03/17/2020  . Overweight (BMI 25.0-29.9) 03/17/2020  . Menopause present, declines hormone replacement therapy 10/30/2017  . Ovarian cyst, left 11/04/2015  . Abnormal uterine bleeding (AUB) 11/04/2015  . Chronic pain associated with significant psychosocial dysfunction 09/09/2014  . HLD (hyperlipidemia) 09/09/2014  . BP (high blood pressure) 09/09/2014    Past Surgical History:  Procedure Laterality Date  . 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    OB History    Gravida  2   Para  2   Term  2   Preterm      AB      Living  2     SAB      IAB      Ectopic      Multiple      Live Births  2            Home Medications    Prior to Admission medications   Medication Sig Start Date End Date Taking? Authorizing Provider  atorvastatin (LIPITOR) 10 MG tablet Take 1 tablet (10 mg total) by mouth at bedtime. 03/17/20  Yes Danelle Berry, PA-C  benzonatate (TESSALON)  100 MG capsule Take 2 capsules (200 mg total) by mouth every 8 (eight) hours. 09/15/20  Yes Becky Augusta, NP  diclofenac (VOLTAREN) 75 MG EC tablet Take 1 tablet (75 mg total) by mouth 2 (two) times daily as needed for moderate pain. 10/20/19  Yes Cook, Verdis Frederickson, DO  Multiple Vitamins-Minerals (ONE-A-DAY WOMENS PO) Take by mouth.   Yes [provider]  promethazine-dextromethorphan (PROMETHAZINE-DM) 6.25-15 MG/5ML syrup Take 5 mLs by mouth 4 (four) times daily as needed. 09/15/20  Yes Becky Augusta, NP  terbinafine (LAMISIL AT) 1 % cream Apply 1 application topically 2 (two) times daily. 03/26/19  Yes Sowles, Danna Hefty, MD  tiZANidine (ZANAFLEX) 4 MG tablet Take 1 tablet (4 mg total) by mouth every 8 (eight) hours as needed for muscle spasms. 10/20/19  Yes Cook, Jayce G, DO  triamcinolone cream (KENALOG) 0.1 % Apply 1 application topically 2 (two) times daily. 09/01/19  Yes Doren Custard, FNP  ciclopirox (PENLAC) 8 % solution Apply topically at bedtime. Apply over nail and surrounding skin. Apply daily over previous coat. After seven (7) days, may remove with alcohol and continue cycle. 03/17/20   Danelle Berry, PA-C    Family History Family  History  Problem Relation Age of Onset  . Heart disease Sister   . Kidney disease Sister   . Polycystic kidney disease Sister   . Stroke Mother   . Kidney disease Mother   . Polycystic kidney disease Mother   . Cancer Father   . Kidney disease Brother   . Polycystic kidney disease Brother   . Stroke Sister 10  . Polycystic kidney disease Sister   . Diabetes Neg Hx   . Breast cancer Neg Hx     Social History Social History   Tobacco Use  . Smoking status: Never Smoker  . Smokeless tobacco: Never Used  Vaping Use  . Vaping Use: Never used  Substance Use Topics  . Alcohol use: No  . Drug use: No     Allergies   Patient has no known allergies.   Review of Systems Review of Systems  Constitutional: Negative for activity change, appetite  change and fever.  HENT: Positive for rhinorrhea and sore throat. Negative for congestion and ear pain.   Respiratory: Positive for cough. Negative for shortness of breath and wheezing.   Gastrointestinal: Negative for diarrhea, nausea and vomiting.  Musculoskeletal: Negative for arthralgias and myalgias.  Skin: Negative for rash.  Neurological: Negative for headaches.  Hematological: Negative.   Psychiatric/Behavioral: Negative.      Physical Exam Triage Vital Signs ED Triage Vitals  Enc Vitals Group     BP 09/15/20 1832 (!) 162/97     Pulse Rate 09/15/20 1832 91     Resp 09/15/20 1832 18     Temp 09/15/20 1832 98 F (36.7 C)     Temp Source 09/15/20 1832 Oral     SpO2 09/15/20 1832 100 %     Weight 09/15/20 1744 156 lb 4.9 oz (70.9 kg)     Height 09/15/20 1744 5\' 3"  (1.6 m)     Head Circumference --      Peak Flow --      Pain Score 09/15/20 1743 0     Pain Loc --      Pain Edu? --      Excl. in GC? --    No data found.  Updated Vital Signs BP (!) 162/97 (BP Location: Left Arm)   Pulse 91   Temp 98 F (36.7 C) (Oral)   Resp 18   Ht 5\' 3"  (1.6 m)   Wt 156 lb 4.9 oz (70.9 kg)   LMP 10/17/2016   SpO2 100%   BMI 27.69 kg/m   Visual Acuity Right Eye Distance:   Left Eye Distance:   Bilateral Distance:    Right Eye Near:   Left Eye Near:    Bilateral Near:     Physical Exam Vitals and nursing note reviewed.  Constitutional:      General: She is not in acute distress.    Appearance: Normal appearance. She is not toxic-appearing.  HENT:     Head: Normocephalic and atraumatic.     Right Ear: Tympanic membrane, ear canal and external ear normal.     Left Ear: Tympanic membrane, ear canal and external ear normal.     Nose: Nose normal. No congestion or rhinorrhea.     Mouth/Throat:     Mouth: Mucous membranes are moist.     Pharynx: Oropharynx is clear. No posterior oropharyngeal erythema.  Cardiovascular:     Rate and Rhythm: Normal rate and regular  rhythm.     Pulses: Normal pulses.     Heart  sounds: Normal heart sounds. No murmur heard.   Pulmonary:     Effort: Pulmonary effort is normal.     Breath sounds: Normal breath sounds. No wheezing, rhonchi or rales.  Musculoskeletal:     Cervical back: Normal range of motion and neck supple.  Lymphadenopathy:     Cervical: No cervical adenopathy.  Skin:    General: Skin is warm and dry.     Capillary Refill: Capillary refill takes less than 2 seconds.     Findings: No erythema.  Neurological:     General: No focal deficit present.     Mental Status: She is alert and oriented to person, place, and time.  Psychiatric:        Mood and Affect: Mood normal.        Behavior: Behavior normal.        Thought Content: Thought content normal.        Judgment: Judgment normal.      UC Treatments / Results  Labs (all labs ordered are listed, but only abnormal results are displayed) Labs Reviewed  SARS CORONAVIRUS 2 (TAT 6-24 HRS)    EKG   Radiology No results found.  Procedures Procedures (including critical care time)  Medications Ordered in UC Medications - No data to display  Initial Impression / Assessment and Plan / UC Course  I have reviewed the triage vital signs and the nursing notes.  Pertinent labs & imaging results that were available during my care of the patient were reviewed by me and considered in my medical decision making (see chart for details).   Patient presents for evaluation of scratchy throat, cough, and chills x3 days.  Patient has had a recent Covid exposure.  Patient is nontoxic in appearance.  Patient's upper respiratory tree is unremarkable on physical exam and her lungs are clear to auscultation all fields.  Will give patient Tessalon Perles and Promethazine DM for cough and runny nose and have her isolate pending her Covid test.   Final Clinical Impressions(s) / UC Diagnoses   Final diagnoses:  Viral URI with cough     Discharge  Instructions     Isolate at home until the results of your Covid test are known.  If you test positive then you will need to quarantine for 5 days from the onset of your symptoms since you have been fully vaccinated and had your booster shot.  After the 5 days you can break quarantine as long as you remain mask around others.  Use Tylenol and ibuprofen as needed for fever and body aches.    Use the Tessalon Perles during the day as needed for cough and the Promethazine DM cough syrup at nighttime as needed for cough and runny nose.  If you develop shortness of breath, especially at rest, you cannot speak full sentences, or you develop bluing of your lips you need to go to the ER for evaluation.    ED Prescriptions    Medication Sig Dispense Auth. Provider   benzonatate (TESSALON) 100 MG capsule Take 2 capsules (200 mg total) by mouth every 8 (eight) hours. 21 capsule Margarette Canada, NP   promethazine-dextromethorphan (PROMETHAZINE-DM) 6.25-15 MG/5ML syrup Take 5 mLs by mouth 4 (four) times daily as needed. 118 mL Margarette Canada, NP     PDMP not reviewed this encounter.   Margarette Canada, NP 09/15/20 1858

## 2020-09-15 NOTE — Discharge Instructions (Addendum)
Isolate at home until the results of your Covid test are known.  If you test positive then you will need to quarantine for 5 days from the onset of your symptoms since you have been fully vaccinated and had your booster shot.  After the 5 days you can break quarantine as long as you remain mask around others.  Use Tylenol and ibuprofen as needed for fever and body aches.    Use the Tessalon Perles during the day as needed for cough and the Promethazine DM cough syrup at nighttime as needed for cough and runny nose.  If you develop shortness of breath, especially at rest, you cannot speak full sentences, or you develop bluing of your lips you need to go to the ER for evaluation.

## 2020-09-16 LAB — SARS CORONAVIRUS 2 (TAT 6-24 HRS): SARS Coronavirus 2: POSITIVE — AB

## 2020-09-20 ENCOUNTER — Ambulatory Visit: Payer: 59 | Admitting: Family Medicine

## 2020-12-21 ENCOUNTER — Encounter: Payer: Self-pay | Admitting: Family Medicine

## 2020-12-21 ENCOUNTER — Ambulatory Visit (INDEPENDENT_AMBULATORY_CARE_PROVIDER_SITE_OTHER): Payer: 59 | Admitting: Family Medicine

## 2020-12-21 ENCOUNTER — Other Ambulatory Visit: Payer: Self-pay

## 2020-12-21 VITALS — BP 132/84 | HR 86 | Temp 98.1°F | Resp 16 | Ht 63.0 in | Wt 156.0 lb

## 2020-12-21 DIAGNOSIS — Z23 Encounter for immunization: Secondary | ICD-10-CM

## 2020-12-21 DIAGNOSIS — M25551 Pain in right hip: Secondary | ICD-10-CM

## 2020-12-21 DIAGNOSIS — R21 Rash and other nonspecific skin eruption: Secondary | ICD-10-CM

## 2020-12-21 MED ORDER — MELOXICAM 15 MG PO TABS: 15.0000 mg | ORAL_TABLET | Freq: Every day | ORAL | 0 refills | Status: DC

## 2020-12-21 MED ORDER — LORATADINE 10 MG PO TABS: 10.0000 mg | ORAL_TABLET | Freq: Every day | ORAL | 0 refills | Status: DC

## 2020-12-21 NOTE — Progress Notes (Signed)
Name: Gail Harper   MRN: [id number]    DOB: 1965/11/28   Date:12/21/2020       Progress Note  Subjective  Chief Complaint  Hip Pain- Right  HPI   Hip pain: she states a few weeks ago she developed right upper outer hip pain. She is physically active, walks either on treadmill or outside most days of the week., so when she noticed the pain she assumed it was an injury but because it did not resolve within a few days she decided to schedule a visit. Pain does not radiate, it is described as dull but daily, she has been taking Aleve prn. She states pain is mild but worse when shifting positions from sitting to stand and sometimes when laying on her right side. She states symptoms are better now. No fever or chills. Denies bowel or bladder incontinence.   Rash on  right popliteal fossa: going on for weeks, she thought it was an insect bite. It is pruriginous, she has been using topical steroids and states it has helped some    Patient Active Problem List   Diagnosis Date Noted  . Iron deficiency anemia 03/17/2020  . Family history of adult polycystic kidney disease 03/17/2020  . Onychomycosis 03/17/2020  . Overweight (BMI 25.0-29.9) 03/17/2020  . Menopause present, declines hormone replacement therapy 10/30/2017  . Ovarian cyst, left 11/04/2015  . Abnormal uterine bleeding (AUB) 11/04/2015  . Chronic neck pain 09/09/2014  . HLD (hyperlipidemia) 09/09/2014  . BP (high blood pressure) 09/09/2014    Past Surgical History:  Procedure Laterality Date  . 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    Family History  Problem Relation Age of Onset  . Heart disease Sister   . Kidney disease Sister   . Polycystic kidney disease Sister   . Stroke Mother   . Kidney disease Mother   . Polycystic kidney disease Mother   . Cancer Father   . Kidney disease Brother   . Polycystic kidney disease Brother   . Stroke Sister 73  . Polycystic kidney disease Sister   . Diabetes Neg Hx   . Breast  cancer Neg Hx     Social History   Tobacco Use  . Smoking status: Never Smoker  . Smokeless tobacco: Never Used  Substance Use Topics  . Alcohol use: No     Current Outpatient Medications:  .  atorvastatin (LIPITOR) 10 MG tablet, Take 1 tablet (10 mg total) by mouth at bedtime., Disp: 90 tablet, Rfl: 3 .  ciclopirox (PENLAC) 8 % solution, Apply topically at bedtime. Apply over nail and surrounding skin. Apply daily over previous coat. After seven (7) days, may remove with alcohol and continue cycle., Disp: 6.6 mL, Rfl: 0 .  loratadine (CLARITIN) 10 MG tablet, Take 1 tablet (10 mg total) by mouth daily., Disp: 30 tablet, Rfl: 0 .  meloxicam (MOBIC) 15 MG tablet, Take 1 tablet (15 mg total) by mouth daily., Disp: 30 tablet, Rfl: 0 .  Multiple Vitamins-Minerals (ONE-A-DAY WOMENS PO), Take by mouth., Disp: , Rfl:   No Known Allergies  I personally reviewed active problem list, medication list, allergies, family history, social history, health maintenance with the patient/caregiver today.   ROS  Constitutional: Negative for fever or weight change.  Respiratory: Negative for cough and shortness of breath.   Cardiovascular: Negative for chest pain or palpitations.  Gastrointestinal: Negative for abdominal pain, no bowel changes.  Musculoskeletal: Negative for gait problem or joint swelling.  Skin: positive for rash.  Neurological: Negative for dizziness or headache.  No other specific complaints in a complete review of systems (except as listed in HPI above).   Objective  Vitals:   12/21/20 1305  BP: 132/84  Pulse: 86  Resp: 16  Temp: 98.1 F (36.7 C)  TempSrc: Oral  SpO2: 95%  Weight: 156 lb (70.8 kg)  Height: 5\' 3"  (1.6 m)    Body mass index is 27.63 kg/m.  Physical Exam  Constitutional: Patient appears well-developed and well-nourished. Overwegitht No distress.  HEENT: head atraumatic, normocephalic, pupils equal and reactive to light, neck  supple Cardiovascular: Normal rate, regular rhythm and normal heart sounds.  No murmur heard. No BLE edema. Pulmonary/Chest: Effort normal and breath sounds normal. No respiratory distress. Abdominal: Soft.  There is no tenderness. skin: linear violaceous rash on right popliteal fossa, looks like an old burn  Muscular Skeletal: normal rom of both hips, normal gait,  Mild pain with palpation of right outer hip  Psychiatric: Patient has a normal mood and affect. behavior is normal. Judgment and thought content normal.  PHQ2/9: Depression screen [facility] 2/9 12/21/2020 03/17/2020 09/01/2019 06/19/2019 03/13/2019  Decreased Interest 0 0 0 0 0  Down, Depressed, Hopeless 0 0 0 0 0  PHQ - 2 Score 0 0 0 0 0  Altered sleeping - 1 0 0 1  Tired, decreased energy - 0 0 0 0  Change in appetite - 0 0 0 0  Feeling bad or failure about yourself  - 0 0 0 0  Trouble concentrating - 0 0 0 0  Moving slowly or fidgety/restless - 0 0 0 0  Suicidal thoughts - 0 0 0 0  PHQ-9 Score - 1 0 0 1  Difficult doing work/chores - Not difficult at all Not difficult at all Not difficult at all Not difficult at all    phq 9 is negative  Fall Risk: Fall Risk  12/21/2020 03/17/2020 09/01/2019 06/19/2019 03/13/2019  Falls in the past year? 0 0 0 0 0  Number falls in past yr: 0 0 0 0 0  Injury with Fall? 0 1 0 0 0  Follow up - Falls evaluation completed Falls evaluation completed Falls evaluation completed Falls evaluation completed    Functional Status Survey: Is the patient deaf or have difficulty hearing?: No Does the patient have difficulty seeing, even when wearing glasses/contacts?: No Does the patient have difficulty concentrating, remembering, or making decisions?: No Does the patient have difficulty walking or climbing stairs?: No Does the patient have difficulty dressing or bathing?: No Does the patient have difficulty doing errands alone such as visiting a doctor's office or shopping?: No   Assessment & Plan  1. Hip  pain, right  - meloxicam (MOBIC) 15 MG tablet; Take 1 tablet (15 mg total) by mouth daily.  Dispense: 30 tablet; Refill: 0 -Explained it may secondary to tendinitis   2. Rash  - loratadine (CLARITIN) 10 MG tablet; Take 1 tablet (10 mg total) by mouth daily.  Dispense: 30 tablet; Refill: 0

## 2021-01-13 ENCOUNTER — Encounter: Payer: Self-pay | Admitting: Family Medicine

## 2021-01-14 ENCOUNTER — Other Ambulatory Visit: Payer: Self-pay | Admitting: Family Medicine

## 2021-01-14 DIAGNOSIS — R21 Rash and other nonspecific skin eruption: Secondary | ICD-10-CM

## 2021-01-14 MED ORDER — TRIAMCINOLONE ACETONIDE 0.1 % EX CREA: 1.0000 "application " | TOPICAL_CREAM | Freq: Two times a day (BID) | CUTANEOUS | 0 refills | Status: DC

## 2021-01-17 ENCOUNTER — Other Ambulatory Visit: Payer: Self-pay | Admitting: Family Medicine

## 2021-01-17 DIAGNOSIS — R21 Rash and other nonspecific skin eruption: Secondary | ICD-10-CM

## 2021-01-17 DIAGNOSIS — M25551 Pain in right hip: Secondary | ICD-10-CM

## 2021-01-17 NOTE — Telephone Encounter (Signed)
lvm to inform that both scripts have been sent to pharmacy

## 2021-03-25 ENCOUNTER — Encounter: Payer: 59 | Admitting: Family Medicine

## 2021-04-01 ENCOUNTER — Other Ambulatory Visit: Payer: Self-pay

## 2021-04-01 ENCOUNTER — Encounter: Payer: Self-pay | Admitting: Family Medicine

## 2021-04-01 ENCOUNTER — Ambulatory Visit (INDEPENDENT_AMBULATORY_CARE_PROVIDER_SITE_OTHER): Payer: 59 | Admitting: Family Medicine

## 2021-04-01 VITALS — BP 132/84 | HR 94 | Temp 98.3°F | Resp 16 | Ht 63.0 in | Wt 160.4 lb

## 2021-04-01 DIAGNOSIS — E782 Mixed hyperlipidemia: Secondary | ICD-10-CM | POA: Diagnosis not present

## 2021-04-01 DIAGNOSIS — Z5181 Encounter for therapeutic drug level monitoring: Secondary | ICD-10-CM | POA: Diagnosis not present

## 2021-04-01 DIAGNOSIS — Z1231 Encounter for screening mammogram for malignant neoplasm of breast: Secondary | ICD-10-CM | POA: Diagnosis not present

## 2021-04-01 DIAGNOSIS — Z0000 Encounter for general adult medical examination without abnormal findings: Secondary | ICD-10-CM

## 2021-04-01 DIAGNOSIS — R21 Rash and other nonspecific skin eruption: Secondary | ICD-10-CM

## 2021-04-01 DIAGNOSIS — M25551 Pain in right hip: Secondary | ICD-10-CM

## 2021-04-01 MED ORDER — LORATADINE 10 MG PO TABS: 10.0000 mg | ORAL_TABLET | Freq: Every day | ORAL | 3 refills | Status: AC

## 2021-04-01 MED ORDER — MELOXICAM 15 MG PO TABS: 15.0000 mg | ORAL_TABLET | Freq: Every day | ORAL | 3 refills | Status: DC | PRN

## 2021-04-01 MED ORDER — TRIAMCINOLONE ACETONIDE 0.1 % EX CREA: 1.0000 "application " | TOPICAL_CREAM | Freq: Two times a day (BID) | CUTANEOUS | 1 refills | Status: DC | PRN

## 2021-04-01 MED ORDER — ATORVASTATIN CALCIUM 10 MG PO TABS: 10.0000 mg | ORAL_TABLET | Freq: Every day | ORAL | 3 refills | Status: DC

## 2021-04-01 NOTE — Progress Notes (Signed)
Patient: Gail Harper, Female    DOB: 05-May-1966, 55 y.o.   MRN: [id number] Delsa Grana, PA-C Visit Date: 04/01/2021  Today's Provider: Delsa Grana, PA-C   Chief Complaint  Patient presents with   Annual Exam   Subjective:   Annual physical exam:  Gail Harper is a 55 y.o. female who presents today for complete physical exam:  Exercise/Activity:  5 d a week 20 min Diet/nutrition:  healthy choices over all - nothing drastic Sleep: no concerns  SDOH Screenings   Alcohol Screen: Not on file  Depression (PHQ2-9): Low Risk    PHQ-2 Score: 0  Financial Resource Strain: Low Risk    Difficulty of Paying Living Expenses: Not hard at all  Food Insecurity: No Food Insecurity   Worried About Charity fundraiser in the Last Year: Never true   Theresa in the Last Year: Never true  Housing: Not on file  Physical Activity: Insufficiently Active   Days of Exercise per Week: 4 days   Minutes of Exercise per Session: 30 min  Social Connections: Moderately Integrated   Frequency of Communication with Friends and Family: More than three times a week   Frequency of Social Gatherings with Friends and Family: More than three times a week   Attends Religious Services: 1 to 4 times per year   Active Member of [employer] or Organizations: Not on file   Attends Archivist Meetings: More than 4 times per year   Marital Status: Widowed  Stress: No Stress Concern Present   Feeling of Stress : Only a little  Tobacco Use: Low Risk    Smoking Tobacco Use: Never   Smokeless Tobacco Use: Never  Transportation Needs: No Transportation Needs   Lack of Transportation (Medical): No   Lack of Transportation (Non-Medical): No   HLD - on lipitor - rx last year Lab Results  Component Value Date   CHOL 200 (H) 03/17/2020   HDL 48 (L) 03/17/2020   LDLCALC 120 (H) 03/17/2020   TRIG 202 (H) 03/17/2020   CHOLHDL 4.2 03/17/2020     USPSTF grade A and B recommendations - reviewed  and addressed today  Depression:  Phq 9 completed today by patient, was reviewed by me with patient in the room PHQ score is neg, pt feels good PHQ 2/9 Scores 04/01/2021 12/21/2020 03/17/2020 09/01/2019  PHQ - 2 Score 0 0 0 0  PHQ- 9 Score 0 - 1 0   Depression screen [facility] 2/9 04/01/2021 12/21/2020 03/17/2020 09/01/2019 06/19/2019  Decreased Interest 0 0 0 0 0  Down, Depressed, Hopeless 0 0 0 0 0  PHQ - 2 Score 0 0 0 0 0  Altered sleeping 0 - 1 0 0  Tired, decreased energy 0 - 0 0 0  Change in appetite 0 - 0 0 0  Feeling bad or failure about yourself  0 - 0 0 0  Trouble concentrating 0 - 0 0 0  Moving slowly or fidgety/restless 0 - 0 0 0  Suicidal thoughts 0 - 0 0 0  PHQ-9 Score 0 - 1 0 0  Difficult doing work/chores Not difficult at all - Not difficult at all Not difficult at all Not difficult at all    Alcohol screening: [place] Office Visit from 03/17/2020 in [facility]  AUDIT-C Score 0       Immunizations and Health Maintenance: Health Maintenance  Topic Date Due   Zoster Vaccines- Shingrix (1 of  2) Never done   COVID-19 Vaccine (4 - Booster for Pfizer series) 01/08/2021   INFLUENZA VACCINE  04/11/2021   PAP SMEAR-Modifier  11/18/2021   MAMMOGRAM  06/02/2022   COLONOSCOPY (Pts 45-23yr Insurance coverage will need to be confirmed)  11/16/2024   TETANUS/TDAP  12/22/2030   Hepatitis C Screening  Completed   HIV Screening  Completed   Pneumococcal Vaccine [id number] old  Aged Out   HPV VACCINES  Aged Out     Hep C Screening: done  STD testing and prevention (HIV/chl/gon/syphilis):  see above, no additional testing desired by pt today  Intimate partner violence:feels safe  Sexual History/Pain during Intercourse: Widowed  Menstrual History/LMP/Abnormal Bleeding: postmenopausal no AUB Patient's last menstrual period was 10/17/2016.  Incontinence Symptoms: none, denies  Breast cancer: due Sept Last Mammogram: *see HM list above BRCA gene  screening: n/a that she knows  Cervical cancer screening: UTD Pt denies family hx of cancers - breast, ovarian, uterine, colon:     Osteoporosis:   Discussion on osteoporosis per age, including high calcium and vitamin D supplementation, weight bearing exercises Pt is supplementing with daily calcium/Vit D. In multivitamin and tums  Skin cancer:  Hx of skin CA -  NO- skin lesion to back of right knee - flat, hyperpigmented, unchanged for 2 months - pt watching Discussed atypical lesions   Colorectal cancer:   Colonoscopy is UTD Discussed concerning signs and sx of CRC, pt denies melena, hematochezia, change in bowles  Lung cancer:   Low Dose CT Chest recommended if Age 57-80years, 30 pack-year currently smoking OR have quit w/in 15years. Patient does not qualify.    Social History   Tobacco Use   Smoking status: Never   Smokeless tobacco: Never  Vaping Use   Vaping Use: Never used  Substance Use Topics   Alcohol use: No   Drug use: No     Flowsheet Row [facility]UD[facility]C Score 0       Family History  Problem Relation Age of Onset   Heart disease Sister    Kidney disease Sister    Polycystic kidney disease Sister    Stroke Mother    Kidney disease Mother    Polycystic kidney disease Mother    Cancer Father    Kidney disease Brother    Polycystic kidney disease Brother    Stroke Sister 62  Polycystic kidney disease Sister    Diabetes Neg Hx    Breast cancer Neg Hx      Blood pressure/Hypertension: BP Readings from Last 3 Encounters:  04/01/21 132/84  12/21/20 132/84  09/15/20 (!) 162/97    Weight/Obesity: Wt Readings from Last 3 Encounters:  04/01/21 160 lb 6.4 oz (72.8 kg)  12/21/20 156 lb (70.8 kg)  09/15/20 156 lb 4.9 oz (70.9 kg)   BMI Readings from Last 3 Encounters:  04/01/21 28.41 kg/m  12/21/20 27.63 kg/m  09/15/20 27.69 kg/m     Lipids:  Lab Results  Component Value Date   CHOL  200 (H) 03/17/2020   CHOL 149 11/19/2018   Lab Results  Component Value Date   HDL 48 (L) 03/17/2020   HDL 46 11/19/2018   Lab Results  Component Value Date   LDLCALC 120 (H) 03/17/2020   LDLCALC 76 11/19/2018   Lab Results  Component Value Date   TRIG 202 (H) 03/17/2020   TRIG 136 11/19/2018   Lab Results  Component Value Date  CHOLHDL 4.2 03/17/2020   CHOLHDL 3.2 11/19/2018   No results found for: LDLDIRECT Based on the results of lipid panel his/her cardiovascular risk factor ( using [facility] )  in the next 10 years is: The 10-year ASCVD risk score Mikey Bussing DC Brooke Bonito., et al., 2013) is: 4.4%   Values used to calculate the score:     Age: 61 years     Sex: Female     Is Non-Hispanic African American: Yes     Diabetic: No     Tobacco smoker: No     Systolic Blood Pressure: 350 mmHg     Is BP treated: No     HDL Cholesterol: 48 mg/dL     Total Cholesterol: 200 mg/dL Glucose:  Glucose, Bld  Date Value Ref Range Status  03/17/2020 87 65 - 99 mg/dL Final    Comment:    .            Fasting reference interval .   03/13/2019 100 (H) 65 - 99 mg/dL Final    Comment:    .            Fasting reference interval . For someone without known diabetes, a glucose value between 100 and 125 mg/dL is consistent with prediabetes and should be confirmed with a follow-up test. .    Hypertension: BP Readings from Last 3 Encounters:  04/01/21 132/84  12/21/20 132/84  09/15/20 (!) 162/97   Obesity: Wt Readings from Last 3 Encounters:  04/01/21 160 lb 6.4 oz (72.8 kg)  12/21/20 156 lb (70.8 kg)  09/15/20 156 lb 4.9 oz (70.9 kg)   BMI Readings from Last 3 Encounters:  04/01/21 28.41 kg/m  12/21/20 27.63 kg/m  09/15/20 27.69 kg/m      Advanced Care Planning:  A voluntary discussion about advance care planning including the explanation and discussion of advance directives.   Discussed health care proxy and Living will, and the patient was able to identify a health  care proxy as daughters.   Patient does not have a living will at present time.   Social History      She        Social History   Socioeconomic History   Marital status: Widowed    Spouse name: Not on file   Number of children: 2   Years of education: Not on file   Highest education level: Not on file  Occupational History   Not on file  Tobacco Use   Smoking status: Never   Smokeless tobacco: Never  Vaping Use   Vaping Use: Never used  Substance and Sexual Activity   Alcohol use: No   Drug use: No   Sexual activity: Not Currently    Partners: Male  Other Topics Concern   Not on file  Social History Narrative   Not on file   Social Determinants of Health   Financial Resource Strain: Not on file  Food Insecurity: Not on file  Transportation Needs: Not on file  Physical Activity: Sufficiently Active   Days of Exercise per Week: 5 days   Minutes of Exercise per Session: 30 min  Stress: Not on file  Social Connections: Not on file    Family History        Family History  Problem Relation Age of Onset   Heart disease Sister    Kidney disease Sister    Polycystic kidney disease Sister    Stroke Mother    Kidney disease Mother  Polycystic kidney disease Mother    Cancer Father    Kidney disease Brother    Polycystic kidney disease Brother    Stroke Sister 11   Polycystic kidney disease Sister    Diabetes Neg Hx    Breast cancer Neg Hx     Patient Active Problem List   Diagnosis Date Noted   Iron deficiency anemia 03/17/2020   Family history of adult polycystic kidney disease 03/17/2020   Onychomycosis 03/17/2020   Overweight (BMI 25.0-29.9) 03/17/2020   Menopause present, declines hormone replacement therapy 10/30/2017   Ovarian cyst, left 11/04/2015   Abnormal uterine bleeding (AUB) 11/04/2015   Chronic neck pain 09/09/2014   HLD (hyperlipidemia) 09/09/2014   BP (high blood pressure) 09/09/2014    Past Surgical History:  Procedure Laterality  Date   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     Current Outpatient Medications:    atorvastatin (LIPITOR) 10 MG tablet, Take 1 tablet (10 mg total) by mouth at bedtime., Disp: 90 tablet, Rfl: 3   ciclopirox (PENLAC) 8 % solution, Apply topically at bedtime. Apply over nail and surrounding skin. Apply daily over previous coat. After seven (7) days, may remove with alcohol and continue cycle., Disp: 6.6 mL, Rfl: 0   loratadine (CLARITIN) 10 MG tablet, TAKE 1 TABLET BY MOUTH EVERY DAY, Disp: 90 tablet, Rfl: 0   meloxicam (MOBIC) 15 MG tablet, TAKE 1 TABLET (15 MG TOTAL) BY MOUTH DAILY., Disp: 90 tablet, Rfl: 0   Multiple Vitamins-Minerals (ONE-A-DAY WOMENS PO), Take by mouth., Disp: , Rfl:    triamcinolone cream (KENALOG) 0.1 %, Apply 1 application topically 2 (two) times daily., Disp: 30 g, Rfl: 0  No Known Allergies  Patient Care Team: Delsa Grana, PA-C as PCP - General (Family Medicine)  Review of Systems  Constitutional: Negative.  Negative for activity change, appetite change, fatigue and unexpected weight change.  HENT: Negative.    Eyes: Negative.   Respiratory: Negative.  Negative for shortness of breath.   Cardiovascular: Negative.  Negative for chest pain, palpitations and leg swelling.  Gastrointestinal: Negative.  Negative for abdominal pain and blood in stool.  Endocrine: Negative.   Genitourinary: Negative.   Musculoskeletal: Negative.  Negative for arthralgias, gait problem, joint swelling and myalgias.  Skin: Negative.  Negative for pallor and rash.  Allergic/Immunologic: Negative.   Neurological: Negative.  Negative for syncope and weakness.  Hematological: Negative.   Psychiatric/Behavioral: Negative.  Negative for dysphoric mood, self-injury and suicidal ideas. The patient is not nervous/anxious.   All other systems reviewed and are negative.   I personally reviewed active problem list, medication list, allergies, family history, social history, health  maintenance, notes from last encounter, lab results, imaging with the patient/caregiver today.        Objective:   Vitals:  Vitals:   04/01/21 1355  BP: 132/84  Pulse: 94  Resp: 16  Temp: 98.3 F (36.8 C)  SpO2: 98%  Weight: 160 lb 6.4 oz (72.8 kg)  Height: 5' 3"  (1.6 m)    Body mass index is 28.41 kg/m.  Physical Exam Vitals and nursing note reviewed.  Constitutional:      General: She is not in acute distress.    Appearance: Normal appearance. She is well-developed. She is not ill-appearing, toxic-appearing or diaphoretic.     Interventions: Face mask in place.  HENT:     Head: Normocephalic and atraumatic.     Right Ear: External ear normal.     Left  Ear: External ear normal.  Eyes:     General: Lids are normal. No scleral icterus.       Right eye: No discharge.        Left eye: No discharge.     Conjunctiva/sclera: Conjunctivae normal.  Neck:     Trachea: Phonation normal. No tracheal deviation.  Cardiovascular:     Rate and Rhythm: Normal rate and regular rhythm.     Pulses: Normal pulses.          Radial pulses are 2+ on the right side and 2+ on the left side.       Posterior tibial pulses are 2+ on the right side and 2+ on the left side.     Heart sounds: Normal heart sounds. No murmur heard.   No friction rub. No gallop.  Pulmonary:     Effort: Pulmonary effort is normal. No respiratory distress.     Breath sounds: Normal breath sounds. No stridor. No wheezing, rhonchi or rales.  Chest:     Chest wall: No tenderness.  Abdominal:     General: Bowel sounds are normal. There is no distension.     Palpations: Abdomen is soft.  Musculoskeletal:     Right lower leg: No edema.     Left lower leg: No edema.  Skin:    General: Skin is warm and dry.     Coloration: Skin is not jaundiced or pale.     Findings: No rash.  Neurological:     Mental Status: She is alert.     Motor: No abnormal muscle tone.     Gait: Gait normal.  Psychiatric:        Mood and  Affect: Mood normal.        Speech: Speech normal.        Behavior: Behavior normal.      Fall Risk: Fall Risk  04/01/2021 12/21/2020 03/17/2020 09/01/2019 06/19/2019  Falls in the past year? 0 0 0 0 0  Number falls in past yr: 0 0 0 0 0  Injury with Fall? 0 0 1 0 0  Follow up - - Falls evaluation completed Falls evaluation completed Falls evaluation completed    Functional Status Survey: Is the patient deaf or have difficulty hearing?: No Does the patient have difficulty seeing, even when wearing glasses/contacts?: No Does the patient have difficulty concentrating, remembering, or making decisions?: No Does the patient have difficulty walking or climbing stairs?: No Does the patient have difficulty dressing or bathing?: No Does the patient have difficulty doing errands alone such as visiting a doctor's office or shopping?: No   Assessment & Plan:    CPE completed today  USPSTF grade A and B recommendations reviewed with patient; age-appropriate recommendations, preventive care, screening tests, etc discussed and encouraged; healthy living encouraged; see AVS for patient education given to patient  Discussed importance of 150 minutes of physical activity weekly, AHA exercise recommendations given to pt in AVS/handout  Discussed importance of healthy diet:  eating lean meats and proteins, avoiding trans fats and saturated fats, avoid simple sugars and excessive carbs in diet, eat 6 servings of fruit/vegetables daily and drink plenty of water and avoid sweet beverages.    Recommended pt to do annual eye exam and routine dental exams/cleanings  Depression, alcohol, fall screening completed as documented above and per flowsheets  Reviewed Health Maintenance: Health Maintenance  Topic Date Due   Zoster Vaccines- Shingrix (1 of 2) Never done   COVID-19 Vaccine (4 -  Booster for [employer] series) 01/08/2021   INFLUENZA VACCINE  04/11/2021   PAP SMEAR-Modifier  11/18/2021   MAMMOGRAM   06/02/2022   COLONOSCOPY (Pts 45-22yr Insurance coverage will need to be confirmed)  11/16/2024   TETANUS/TDAP  12/22/2030   Hepatitis C Screening  Completed   HIV Screening  Completed   Pneumococcal Vaccine 085650Years old  Aged Out   HPV VACCINES  Aged Out    Immunizations: Immunization History  Administered Date(s) Administered   Influenza Inj Mdck Quad With Preservative 07/06/2018   Influenza,inj,Quad PF,6+ Mos 06/07/2017, 06/27/2019   Influenza,inj,quad, With Preservative 07/15/2016   Influenza-Unspecified 07/10/2020   PFIZER(Purple Top)SARS-COV-2 Vaccination 12/22/2019, 01/13/2020, 09/09/2020   Tdap 12/21/2020      ICD-10-CM   1. Adult general medical exam  ZB09.62COMPLETE METABOLIC PANEL WITH GFR    Lipid panel    CBC w/Diff/Platelet    2. Mixed hyperlipidemia  E78.2 atorvastatin (LIPITOR) 10 MG tablet    Lipid panel    3. Medication monitoring encounter  Z51.81 atorvastatin (LIPITOR) 10 MG tablet    COMPLETE METABOLIC PANEL WITH GFR    Lipid panel    4. Encounter for screening mammogram for malignant neoplasm of breast  Z12.31 MM 3D SCREEN BREAST BILATERAL    5. Rash  R21 loratadine (CLARITIN) 10 MG tablet    triamcinolone cream (KENALOG) 0.1 %    6. Hip pain, right  M25.551 meloxicam (MOBIC) 15 MG tablet          LDelsa Grana PA-C 04/01/21 1:57 PM  [facility]

## 2021-04-01 NOTE — Patient Instructions (Addendum)
[facility] at [facility] [place],  [place]  [address] Get Driving Directions Main: [phone]  Health Maintenance  Topic Date Due   Zoster (Shingles) Vaccine (1 of 2) Never done   COVID-19 Vaccine (4 - Booster for Pfizer series) 01/08/2021   Flu Shot  04/11/2021   Pap Smear  11/18/2021   Mammogram  06/02/2022   Colon Cancer Screening  11/16/2024   Tetanus Vaccine  12/22/2030   Hepatitis C Screening: USPSTF Recommendation to screen - Ages 18-79 yo.  Completed   HIV Screening  Completed   Pneumococcal Vaccination  Aged Out   HPV Vaccine  Aged Out      Preventive Care 57-90 Years Old, Female Preventive care refers to lifestyle choices and visits with your health care provider that can promote health and wellness. This includes: A yearly physical exam. This is also called an annual wellness visit. Regular dental and eye exams. Immunizations. Screening for certain conditions. Healthy lifestyle choices, such as: Eating a healthy diet. Getting regular exercise. Not using drugs or products that contain nicotine and tobacco. Limiting alcohol use. What can I expect for my preventive care visit? Physical exam Your health care provider will check your: Height and weight. These may be used to calculate your BMI (body mass index). BMI is a measurement that tells if you are at a healthy weight. Heart rate and blood pressure. Body temperature. Skin for abnormal spots. Counseling Your health care provider may ask you questions about your: Past medical problems. Family's medical history. Alcohol, tobacco, and drug use. Emotional well-being. Home life and relationship well-being. Sexual activity. Diet, exercise, and sleep habits. Work and work Statistician. Access to firearms. Method of birth control. Menstrual cycle. Pregnancy history. What immunizations do I need?  Vaccines are usually given at various ages, according to a schedule. Your  health care provider will recommend vaccines for you based on your age, medicalhistory, and lifestyle or other factors, such as travel or where you work. What tests do I need? Blood tests Lipid and cholesterol levels. These may be checked every 5 years, or more often if you are over 57 years old. Hepatitis C test. Hepatitis B test. Screening Lung cancer screening. You may have this screening every year starting at age 57 if you have a 30-pack-year history of smoking and currently smoke or have quit within the past 15 years. Colorectal cancer screening. All adults should have this screening starting at age 57 and continuing until age 18. Your health care provider may recommend screening at age 57 if you are at increased risk. You will have tests every 1-10 years, depending on your results and the type of screening test. Diabetes screening. This is done by checking your blood sugar (glucose) after you have not eaten for a while (fasting). You may have this done every 1-3 years. Mammogram. This may be done every 1-2 years. Talk with your health care provider about when you should start having regular mammograms. This may depend on whether you have a family history of breast cancer. BRCA-related cancer screening. This may be done if you have a family history of breast, ovarian, tubal, or peritoneal cancers. Pelvic exam and Pap test. This may be done every 3 years starting at age 57. Starting at age 57, this may be done every 5 years if you have a Pap test in combination with an HPV test. Other tests STD (sexually transmitted disease) testing, if you are at risk. Bone density scan.  This is done to screen for osteoporosis. You may have this scan if you are at high risk for osteoporosis. Talk with your health care provider about your test results, treatment options,and if necessary, the need for more tests. Follow these instructions at home: Eating and drinking  Eat a diet that includes fresh  fruits and vegetables, whole grains, lean protein, and low-fat dairy products. Take vitamin and mineral supplements as recommended by your health care provider. Do not drink alcohol if: Your health care provider tells you not to drink. You are pregnant, may be pregnant, or are planning to become pregnant. If you drink alcohol: Limit how much you have to 0-1 drink a day. Be aware of how much alcohol is in your drink. In the [place] one drink equals one 12 oz bottle of beer (355 mL), one 5 oz glass of wine (148 mL), or one 1 oz glass of hard liquor (44 mL).  Lifestyle Take daily care of your teeth and gums. Brush your teeth every morning and night with fluoride toothpaste. Floss one time each day. Stay active. Exercise for at least 30 minutes 5 or more days each week. Do not use any products that contain nicotine or tobacco, such as cigarettes, e-cigarettes, and chewing tobacco. If you need help quitting, ask your health care provider. Do not use drugs. If you are sexually active, practice safe sex. Use a condom or other form of protection to prevent STIs (sexually transmitted infections). If you do not wish to become pregnant, use a form of birth control. If you plan to become pregnant, see your health care provider for a prepregnancy visit. If told by your health care provider, take low-dose aspirin daily starting at age 57. Find healthy ways to cope with stress, such as: Meditation, yoga, or listening to music. Journaling. Talking to a trusted person. Spending time with friends and family. Safety Always wear your seat belt while driving or riding in a vehicle. Do not drive: If you have been drinking alcohol. Do not ride with someone who has been drinking. When you are tired or distracted. While texting. Wear a helmet and other protective equipment during sports activities. If you have firearms in your house, make sure you follow all gun safety procedures. What's next? Visit your health  care provider once a year for an annual wellness visit. Ask your health care provider how often you should have your eyes and teeth checked. Stay up to date on all vaccines. This information is not intended to replace advice given to you by your health care provider. Make sure you discuss any questions you have with your healthcare provider. Document Revised: 06/01/2020 Document Reviewed: 05/09/2018 Elsevier Patient Education  2022 [employer].

## 2021-04-02 ENCOUNTER — Encounter: Payer: Self-pay | Admitting: Family Medicine

## 2021-04-02 LAB — LIPID PANEL
Cholesterol: 189 mg/dL (ref <200)
HDL: 39 mg/dL — ABNORMAL LOW (ref >=50)
LDL Cholesterol (Calc): 113 mg/dL (calc) — ABNORMAL HIGH
Non-HDL Cholesterol (Calc): 150 mg/dL (calc) — ABNORMAL HIGH (ref <130)
Total CHOL/HDL Ratio: 4.8 (calc) (ref <5.0)
Triglycerides: 258 mg/dL — ABNORMAL HIGH (ref <150)

## 2021-04-02 LAB — COMPLETE METABOLIC PANEL WITHOUT GFR
AG Ratio: 1.4 (calc) (ref 1.0–2.5)
ALT: 19 U/L (ref 6–29)
AST: 17 U/L (ref 10–35)
Albumin: 4.4 g/dL (ref 3.6–5.1)
Alkaline phosphatase (APISO): 97 U/L (ref 37–153)
BUN: 12 mg/dL (ref 7–25)
CO2: 29 mmol/L (ref 20–32)
Calcium: 10.3 mg/dL (ref 8.6–10.4)
Chloride: 102 mmol/L (ref 98–110)
Creat: 0.65 mg/dL (ref 0.50–1.03)
Globulin: 3.2 g/dL (ref 1.9–3.7)
Glucose, Bld: 82 mg/dL (ref 65–99)
Potassium: 4 mmol/L (ref 3.5–5.3)
Sodium: 139 mmol/L (ref 135–146)
Total Bilirubin: 0.6 mg/dL (ref 0.2–1.2)
Total Protein: 7.6 g/dL (ref 6.1–8.1)
eGFR: 103 mL/min/1.73m2

## 2021-04-02 LAB — CBC WITH DIFFERENTIAL/PLATELET
Absolute Monocytes: 728 cells/uL (ref 200–950)
Basophils Absolute: 64 cells/uL (ref 0–200)
Basophils Relative: 0.7 %
Eosinophils Absolute: 164 cells/uL (ref 15–500)
Eosinophils Relative: 1.8 %
HCT: 37.6 % (ref 35.0–45.0)
Hemoglobin: 11.9 g/dL (ref 11.7–15.5)
Lymphs Abs: 3340 cells/uL (ref 850–3900)
MCH: 25.8 pg — ABNORMAL LOW (ref 27.0–33.0)
MCHC: 31.6 g/dL — ABNORMAL LOW (ref 32.0–36.0)
MCV: 81.6 fL (ref 80.0–100.0)
MPV: 10.5 fL (ref 7.5–12.5)
Monocytes Relative: 8 %
Neutro Abs: 4805 cells/uL (ref 1500–7800)
Neutrophils Relative %: 52.8 %
Platelets: 317 10*3/uL (ref 140–400)
RBC: 4.61 10*6/uL (ref 3.80–5.10)
RDW: 13.1 % (ref 11.0–15.0)
Total Lymphocyte: 36.7 %
WBC: 9.1 10*3/uL (ref 3.8–10.8)

## 2021-04-19 ENCOUNTER — Encounter: Payer: Self-pay | Admitting: Family Medicine

## 2021-05-02 NOTE — Patient Instructions (Signed)
Breast Self-Awareness Breast self-awareness is knowing how your breasts look and feel. Doing breast self-awareness is important. It allows you to catch a breast problem early while it is still small and can be treated. All women should do breast self-awareness, including women who have had breast implants. Tell your doctorif you notice a change in your breasts. What you need: A mirror. A well-lit room. How to do a breast self-exam A breast self-exam is one way to learn what is normal for your breasts and tocheck for changes. To do a breast self-exam: Look for changes  Take off all the clothes above your waist. Stand in front of a mirror in a room with good lighting. Put your hands on your hips. Push your hands down. Look at your breasts and nipples in the mirror to see if one breast or nipple looks different from the other. Check to see if: The shape of one breast is different. The size of one breast is different. There are wrinkles, dips, and bumps in one breast and not the other. Look at each breast for changes in the skin, such as: Redness. Scaly areas. Look for changes in your nipples, such as: Liquid around the nipples. Bleeding. Dimpling. Redness. A change in where the nipples are.  Feel for changes  Lie on your back on the floor. Feel each breast. To do this, follow these steps: Pick a breast to feel. Put the arm closest to that breast above your head. Use your other arm to feel the nipple area of your breast. Feel the area with the pads of your three middle fingers by making small circles with your fingers. For the first circle, press lightly. For the second circle, press harder. For the third circle, press even harder. Keep making circles with your fingers at the different pressures as you move down your breast. Stop when you feel your ribs. Move your fingers a little toward the center of your body. Start making circles with your fingers again, this time going up until  you reach your collarbone. Keep making up-and-down circles until you reach your armpit. Remember to keep using the three pressures. Feel the other breast in the same way. Sit or stand in the tub or shower. With soapy water on your skin, feel each breast the same way you did in step 2 when you were lying on the floor.  Write down what you find Writing down what you find can help you remember what to tell your doctor. Write down: What is normal for each breast. Any changes you find in each breast, including: The kind of changes you find. Whether you have pain. Size and location of any lumps. When you last had your menstrual period. General tips Check your breasts every month. If you are breastfeeding, the best time to check your breasts is after you feed your baby or after you use a breast pump. If you get menstrual periods, the best time to check your breasts is 5-7 days after your menstrual period is over. With time, you will become comfortable with the self-exam, and you will begin to know if there are changes in your breasts. Contact a doctor if you: See a change in the shape or size of your breasts or nipples. See a change in the skin of your breast or nipples, such as red or scaly skin. Have fluid coming from your nipples that is not normal. Find a lump or thick area that was not there before. Have pain in   your breasts. Have any concerns about your breast health. Summary Breast self-awareness includes looking for changes in your breasts, as well as feeling for changes within your breasts. Breast self-awareness should be done in front of a mirror in a well-lit room. You should check your breasts every month. If you get menstrual periods, the best time to check your breasts is 5-7 days after your menstrual period is over. Let your doctor know of any changes you see in your breasts, including changes in size, changes on the skin, pain or tenderness, or fluid from your nipples that is  not normal. This information is not intended to replace advice given to you by your health care provider. Make sure you discuss any questions you have with your healthcare provider.    Preventive Care 13-36 Years Old, Female Preventive care refers to lifestyle choices and visits with your health care provider that can promote health and wellness. This includes: A yearly physical exam. This is also called an annual wellness visit. Regular dental and eye exams. Immunizations. Screening for certain conditions. Healthy lifestyle choices, such as: Eating a healthy diet. Getting regular exercise. Not using drugs or products that contain nicotine and tobacco. Limiting alcohol use. What can I expect for my preventive care visit? Physical exam Your health care provider will check your: Height and weight. These may be used to calculate your BMI (body mass index). BMI is a measurement that tells if you are at a healthy weight. Heart rate and blood pressure. Body temperature. Skin for abnormal spots. Counseling Your health care provider may ask you questions about your: Past medical problems. Family's medical history. Alcohol, tobacco, and drug use. Emotional well-being. Home life and relationship well-being. Sexual activity. Diet, exercise, and sleep habits. Work and work Statistician. Access to firearms. Method of birth control. Menstrual cycle. Pregnancy history. What immunizations do I need?  Vaccines are usually given at various ages, according to a schedule. Your health care provider will recommend vaccines for you based on your age, medicalhistory, and lifestyle or other factors, such as travel or where you work. What tests do I need? Blood tests Lipid and cholesterol levels. These may be checked every 5 years, or more often if you are over 8 years old. Hepatitis C test. Hepatitis B test. Screening Lung cancer screening. You may have this screening every year starting at age  75 if you have a 30-pack-year history of smoking and currently smoke or have quit within the past 15 years. Colorectal cancer screening. All adults should have this screening starting at age 17 and continuing until age 59. Your health care provider may recommend screening at age 88 if you are at increased risk. You will have tests every 1-10 years, depending on your results and the type of screening test. Diabetes screening. This is done by checking your blood sugar (glucose) after you have not eaten for a while (fasting). You may have this done every 1-3 years. Mammogram. This may be done every 1-2 years. Talk with your health care provider about when you should start having regular mammograms. This may depend on whether you have a family history of breast cancer. BRCA-related cancer screening. This may be done if you have a family history of breast, ovarian, tubal, or peritoneal cancers. Pelvic exam and Pap test. This may be done every 3 years starting at age 70. Starting at age 32, this may be done every 5 years if you have a Pap test in combination with an HPV test.  Other tests STD (sexually transmitted disease) testing, if you are at risk. Bone density scan. This is done to screen for osteoporosis. You may have this scan if you are at high risk for osteoporosis. Talk with your health care provider about your test results, treatment options,and if necessary, the need for more tests. Follow these instructions at home: Eating and drinking  Eat a diet that includes fresh fruits and vegetables, whole grains, lean protein, and low-fat dairy products. Take vitamin and mineral supplements as recommended by your health care provider. Do not drink alcohol if: Your health care provider tells you not to drink. You are pregnant, may be pregnant, or are planning to become pregnant. If you drink alcohol: Limit how much you have to 0-1 drink a day. Be aware of how much alcohol is in your drink. In  the [place] one drink equals one 12 oz bottle of beer (355 mL), one 5 oz glass of wine (148 mL), or one 1 oz glass of hard liquor (44 mL).  Lifestyle Take daily care of your teeth and gums. Brush your teeth every morning and night with fluoride toothpaste. Floss one time each day. Stay active. Exercise for at least 30 minutes 5 or more days each week. Do not use any products that contain nicotine or tobacco, such as cigarettes, e-cigarettes, and chewing tobacco. If you need help quitting, ask your health care provider. Do not use drugs. If you are sexually active, practice safe sex. Use a condom or other form of protection to prevent STIs (sexually transmitted infections). If you do not wish to become pregnant, use a form of birth control. If you plan to become pregnant, see your health care provider for a prepregnancy visit. If told by your health care provider, take low-dose aspirin daily starting at age 6. Find healthy ways to cope with stress, such as: Meditation, yoga, or listening to music. Journaling. Talking to a trusted person. Spending time with friends and family. Safety Always wear your seat belt while driving or riding in a vehicle. Do not drive: If you have been drinking alcohol. Do not ride with someone who has been drinking. When you are tired or distracted. While texting. Wear a helmet and other protective equipment during sports activities. If you have firearms in your house, make sure you follow all gun safety procedures. What's next? Visit your health care provider once a year for an annual wellness visit. Ask your health care provider how often you should have your eyes and teeth checked. Stay up to date on all vaccines. This information is not intended to replace advice given to you by your health care provider. Make sure you discuss any questions you have with your healthcare provider. Document Revised: 06/01/2020 Document Reviewed: 05/09/2018 Elsevier Patient  Education  2022 [place] Revised: 04/16/2018 Document Reviewed: 04/16/2018 Elsevier Patient Education  2022 [employer].

## 2021-05-03 ENCOUNTER — Other Ambulatory Visit: Payer: Self-pay

## 2021-05-03 ENCOUNTER — Ambulatory Visit (INDEPENDENT_AMBULATORY_CARE_PROVIDER_SITE_OTHER): Payer: 59 | Admitting: Obstetrics and Gynecology

## 2021-05-03 ENCOUNTER — Encounter: Payer: Self-pay | Admitting: Obstetrics and Gynecology

## 2021-05-03 VITALS — BP 159/93 | HR 89 | Ht 63.0 in | Wt 162.4 lb

## 2021-05-03 DIAGNOSIS — E785 Hyperlipidemia, unspecified: Secondary | ICD-10-CM | POA: Diagnosis not present

## 2021-05-03 DIAGNOSIS — N951 Menopausal and female climacteric states: Secondary | ICD-10-CM

## 2021-05-03 DIAGNOSIS — Z01419 Encounter for gynecological examination (general) (routine) without abnormal findings: Secondary | ICD-10-CM | POA: Diagnosis not present

## 2021-05-03 DIAGNOSIS — E663 Overweight: Secondary | ICD-10-CM | POA: Diagnosis not present

## 2021-05-03 NOTE — Progress Notes (Signed)
ANNUAL PREVENTATIVE CARE GYNECOLOGY  ENCOUNTER NOTE  Subjective:       Gail Harper is a 55 y.o. [phone] menopausal female here for a routine annual gynecologic exam. The patient is not sexually active. The patient is not taking hormone replacement therapy and she denies post-menopausal vaginal bleeding. The patient wears seatbelts: yes . The patient participates in regular exercise: yes. [facility] on the treadmill at least 20 min per day most days, more on the weekends). Using Apple Watch which has been motivating her to get up and move more during the day. Has the patient ever been transfused or tattooed?: no. The patient reports that there is not domestic violence in her life.  Current complaints: 1.  Notes hot flushes still present, once maximum per day, becoming less frequent. Lasts for a few seconds then subsides.  States that it is manageable and declines medical management.  2.  Reports that she is working on [employer].  Has been trying to find a regimen that works for her, drinking different types of shakes.  Notes that she usually does not routinely eat breakfast but has coffee in the mornings, and only eats lunch and dinner.  Notes that lunch and dinner are usually sensible portions and overall tries to eat healthy.  Is working to improve her cholesterol levels.   Gynecologic History Patient's last menstrual period was 10/17/2016. Contraception: post menopausal status Last Pap: 11/19/2018. Results were: normal Last mammogram: 05/28/2020. Results were: normal. Scheduled for next month.  Last Colonoscopy: ~7 years ago.  Was normal per patient except 2 benign polyps. Recommended q 10 year follow up.     Obstetric History OB History  Gravida Para Term Preterm AB Living  2 2 2     2   SAB IAB Ectopic Multiple Live Births          2    # Outcome Date GA Lbr Len/2nd Weight Sex Delivery Anes PTL Lv  2 Term 1989   7 lb 1.8 oz (3.225 kg) F Vag-Spont   LIV  1 Term 1985   6 lb  6.4 oz (2.903 kg) F Vag-Spont   LIV    Past Medical History:  Diagnosis Date   Anemia    High cholesterol     Family History  Problem Relation Age of Onset   Heart disease Sister    Kidney disease Sister    Polycystic kidney disease Sister    Stroke Mother    Kidney disease Mother    Polycystic kidney disease Mother    Cancer Father    Kidney disease Brother    Polycystic kidney disease Brother    Stroke Sister 65   Polycystic kidney disease Sister    Diabetes Neg Hx    Breast cancer Neg Hx     Past Surgical History:  Procedure Laterality Date   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    Social History   Socioeconomic History   Marital status: Widowed    Spouse name: Not on file   Number of children: 2   Years of education: Not on file   Highest education level: Not on file  Occupational History   Not on file  Tobacco Use   Smoking status: Never   Smokeless tobacco: Never  Vaping Use   Vaping Use: Never used  Substance and Sexual Activity   Alcohol use: No   Drug use: No   Sexual activity: Not Currently    Partners: Male  Other Topics Concern   Not on file  Social History Narrative   Not on file   Social Determinants of Health   Financial Resource Strain: Low Risk    Difficulty of Paying Living Expenses: Not hard at all  Food Insecurity: No Food Insecurity   Worried About Programme researcher, broadcasting/film/video in the Last Year: Never true   Ran Out of Food in the Last Year: Never true  Transportation Needs: No Transportation Needs   Lack of Transportation (Medical): No   Lack of Transportation (Non-Medical): No  Physical Activity: Insufficiently Active   Days of Exercise per Week: 4 days   Minutes of Exercise per Session: 30 min  Stress: No Stress Concern Present   Feeling of Stress : Only a little  Social Connections: Moderately Integrated   Frequency of Communication with Friends and Family: More than three times a week   Frequency of Social Gatherings with  Friends and Family: More than three times a week   Attends Religious Services: 1 to 4 times per year   Active Member of [employer] or Organizations: Not on file   Attends Banker Meetings: More than 4 times per year   Marital Status: Widowed  Catering manager Violence: Not At Risk   Fear of Current or Ex-Partner: No   Emotionally Abused: No   Physically Abused: No   Sexually Abused: No    Current Outpatient Medications on File Prior to Visit  Medication Sig Dispense Refill   atorvastatin (LIPITOR) 10 MG tablet Take 1 tablet (10 mg total) by mouth at bedtime. 90 tablet 3   ciclopirox (PENLAC) 8 % solution Apply topically at bedtime. Apply over nail and surrounding skin. Apply daily over previous coat. After seven (7) days, may remove with alcohol and continue cycle. 6.6 mL 0   loratadine (CLARITIN) 10 MG tablet Take 1 tablet (10 mg total) by mouth daily. 90 tablet 3   meloxicam (MOBIC) 15 MG tablet Take 1 tablet (15 mg total) by mouth daily as needed for pain. 90 tablet 3   Multiple Vitamins-Minerals (ONE-A-DAY WOMENS PO) Take by mouth.     triamcinolone cream (KENALOG) 0.1 % Apply 1 application topically 2 (two) times daily as needed. 30 g 1   No current facility-administered medications on file prior to visit.    No Known Allergies    Review of Systems Review of Systems - General ROS: negative for - chills, fatigue, fever, night sweats, weight gain or weight loss.  Positive for hot flushes (see HPI) Psychological ROS: negative for - anxiety, decreased libido, depression, mood swings, physical abuse or sexual abuse Ophthalmic ROS: negative for - blurry vision, eye pain or loss of vision ENT ROS: negative for - headaches, hearing change, visual changes or vocal changes Allergy and Immunology ROS: negative for - hives, itchy/watery eyes or seasonal allergies Hematological and Lymphatic ROS: negative for - bleeding problems, bruising, swollen lymph nodes or weight  loss Endocrine ROS: negative for - galactorrhea, hair pattern changes, hot flashes, malaise/lethargy, mood swings, palpitations, polydipsia/polyuria, skin changes, temperature intolerance or unexpected weight changes Breast ROS: negative for - new or changing breast lumps or nipple discharge Respiratory ROS: negative for - cough or shortness of breath Cardiovascular ROS: negative for - chest pain, irregular heartbeat, palpitations or shortness of breath Gastrointestinal ROS: no abdominal pain, change in bowel habits, or black or bloody stools Genito-Urinary ROS: no dysuria, trouble voiding, or hematuria Musculoskeletal ROS: negative for - joint pain or joint stiffness Neurological  ROS: negative for - bowel and bladder control changes Dermatological ROS: negative for rash and skin lesion changes   Objective:   BP (!) 159/93 (BP Location: Left Arm, Patient Position: Sitting, Cuff Size: Normal)   Pulse 89   Ht 5\' 3"  (1.6 m)   Wt 162 lb 6.4 oz (73.7 kg)   LMP 10/17/2016   BMI 28.77 kg/m  CONSTITUTIONAL: Well-developed, well-nourished female in no acute distress. Overweight PSYCHIATRIC: Normal mood and affect. Normal behavior. Normal judgment and thought content. NEUROLGIC: Alert and oriented to person, place, and time. Normal muscle tone coordination. No cranial nerve deficit noted. HENT:  Normocephalic, atraumatic, External right and left ear normal. Oropharynx is clear and moist EYES: Conjunctivae and EOM are normal. Pupils are equal, round, and reactive to light. No scleral icterus.  NECK: Normal range of motion, supple, no masses.  Normal thyroid.  SKIN: Skin is warm and dry. No rash noted. Not diaphoretic. No erythema. No pallor. CARDIOVASCULAR: Normal heart rate noted, regular rhythm, no murmur. RESPIRATORY: Clear to auscultation bilaterally. Effort and breath sounds normal, no problems with respiration noted. BREASTS: Symmetric in size. No masses, skin changes, nipple drainage, or  lymphadenopathy. ABDOMEN: Soft, normal bowel sounds, no distention noted.  No tenderness, rebound or guarding.  BLADDER: Normal PELVIC:  Bladder no bladder distension noted  Urethra: normal appearing urethra with no masses, tenderness or lesions  Vulva: normal appearing vulva with no masses, tenderness or lesions  Vagina: normal appearing vagina with normal color and discharge, no lesions  Cervix: normal appearing cervix without discharge or lesions  Uterus: uterus is normal size, shape, consistency and nontender  Adnexa: normal adnexa in size, nontender and no masses  RV: External Exam NormaI, No Rectal Masses and Normal Sphincter tone  MUSCULOSKELETAL: Normal range of motion. No tenderness.  No cyanosis, clubbing, or edema.  2+ distal pulses. LYMPHATIC: No Axillary, Supraclavicular, or Inguinal Adenopathy.   Labs: Lab Results  Component Value Date   WBC 9.1 04/01/2021   HGB 11.9 04/01/2021   HCT 37.6 04/01/2021   MCV 81.6 04/01/2021   PLT 317 04/01/2021    Lab Results  Component Value Date   CREATININE 0.65 04/01/2021   BUN 12 04/01/2021   NA 139 04/01/2021   K 4.0 04/01/2021   CL 102 04/01/2021   CO2 29 04/01/2021    Lab Results  Component Value Date   ALT 19 04/01/2021   AST 17 04/01/2021   BILITOT 0.6 04/01/2021    Lab Results  Component Value Date   CHOL 189 04/01/2021   HDL 39 (L) 04/01/2021   LDLCALC 113 (H) 04/01/2021   TRIG 258 (H) 04/01/2021   CHOLHDL 4.8 04/01/2021    No results found for: TSH  No results found for: HGBA1C   Assessment:   1. Encounter for well woman exam with routine gynecological exam   2. Menopausal hot flushes   3. Hyperlipidemia, unspecified hyperlipidemia type   4. Overweight (BMI 25.0-29.9)    Plan:  Pap: Pap smear up to date, next due in 2023. Mammogram: 06/02/2020 appointment scheduled for 06/08/2021 Stool Guaiac Testing:  Not Indicated. Colonoscopy up to date.  Labs: All labs up to date (recently completed from  PCP in July)  Contraception: post menopausal status.  Routine preventative health maintenance measures emphasized: Exercise/Diet/Weight control, Tobacco Warnings, Alcohol/Substance use risks and Stress Management Menopausal vasomotor symptoms, mild. Patient declines medical management at this time, notes symptoms are manageable.  Covid vaccination status: 3 dose series completed August).  See Epic for documentation.  Hyperlipidemia managed by PCP. Return to Clinic - 1 Year   Hildred Laserherry, Heman Que, MD [facility]

## 2021-06-06 ENCOUNTER — Ambulatory Visit
Admission: RE | Admit: 2021-06-06 | Discharge: 2021-06-06 | Disposition: A | Discharge status: Home / Self Care | Payer: 59 | Source: Ambulatory Visit | Attending: Family Medicine | Admitting: Family Medicine

## 2021-06-06 ENCOUNTER — Other Ambulatory Visit: Payer: Self-pay

## 2021-06-06 DIAGNOSIS — Z1231 Encounter for screening mammogram for malignant neoplasm of breast: Secondary | ICD-10-CM | POA: Diagnosis present

## 2021-06-21 ENCOUNTER — Other Ambulatory Visit: Payer: Self-pay

## 2021-06-21 ENCOUNTER — Ambulatory Visit
Admission: RE | Admit: 2021-06-21 | Discharge: 2021-06-21 | Disposition: A | Discharge status: Home / Self Care | Payer: 59 | Source: Ambulatory Visit | Attending: Physician Assistant | Admitting: Physician Assistant

## 2021-06-21 VITALS — BP 153/88 | HR 82 | Temp 98.3°F | Resp 18 | Ht 63.0 in | Wt 162.5 lb

## 2021-06-21 DIAGNOSIS — R0981 Nasal congestion: Secondary | ICD-10-CM | POA: Diagnosis present

## 2021-06-21 DIAGNOSIS — R051 Acute cough: Secondary | ICD-10-CM

## 2021-06-21 DIAGNOSIS — J069 Acute upper respiratory infection, unspecified: Secondary | ICD-10-CM | POA: Diagnosis present

## 2021-06-21 DIAGNOSIS — Z20822 Contact with and (suspected) exposure to covid-19: Secondary | ICD-10-CM | POA: Insufficient documentation

## 2021-06-21 NOTE — ED Triage Notes (Signed)
Pt c/o nasal congestion, cough, sneezing. Started about 3 days ago. She states she took 2 home covid test and was negative.

## 2021-06-21 NOTE — ED Provider Notes (Signed)
[facility]    CSN: [id number] Arrival date & time: 06/21/21  1543      History   Chief Complaint Chief Complaint  Patient presents with   Cough   Appointment    HPI Gail Harper is a 55 y.o. female presenting for nasal congestion, cough and sneezing for the past 2 to 3 days.  Patient denies fever or fatigue or body aches.  Denies sore throat but has had some postnasal drainage.  No breathing difficulty or chest tightness.  Denies vomiting or diarrhea.  No sick contacts and has had 2 negative at home COVID test but desires PCR testing for confirmation.  Patient has been taking over-the-counter decongestant medication and Claritin.  States she does not feel too bad and has been working from home.  No other concerns.  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 Authorizing Provider  atorvastatin (LIPITOR) 10 MG tablet Take 1 tablet (10 mg total) by mouth at bedtime. 04/01/21  Yes Danelle Berry, PA-C  loratadine (CLARITIN) 10 MG tablet Take 1 tablet (10 mg total) by mouth daily. 04/01/21  Yes  Danelle Berry, PA-C  meloxicam (MOBIC) 15 MG tablet Take 1 tablet (15 mg total) by mouth daily as needed for pain. 04/01/21  Yes Danelle Berry, PA-C  Multiple Vitamins-Minerals (ONE-A-DAY WOMENS PO) Take by mouth.   Yes [provider]  ciclopirox (PENLAC) 8 % solution Apply topically at bedtime. Apply over nail and surrounding skin. Apply daily over previous coat. After seven (7) days, may remove with alcohol and continue cycle. 03/17/20   Danelle Berry, PA-C  triamcinolone cream (KENALOG) 0.1 % Apply 1 application topically 2 (two) times daily as needed. 04/01/21   Danelle Berry, PA-C    Family History Family History  Problem Relation Age of Onset   Heart disease Sister    Kidney disease Sister    Polycystic kidney disease Sister    Stroke Mother    Kidney disease Mother    Polycystic kidney disease Mother    Cancer Father    Kidney disease Brother    Polycystic kidney disease Brother    Stroke Sister 78   Polycystic kidney disease Sister    Diabetes Neg Hx    Breast cancer Neg Hx  Social History Social History   Tobacco Use   Smoking status: Never   Smokeless tobacco: Never  Vaping Use   Vaping Use: Never used  Substance Use Topics   Alcohol use: No   Drug use: No     Allergies   Patient has no known allergies.   Review of Systems Review of Systems  Constitutional:  Negative for chills, diaphoresis, fatigue and fever.  HENT:  Positive for congestion, postnasal drip, rhinorrhea and sneezing. Negative for ear pain, sinus pressure, sinus pain and sore throat.   Respiratory:  Positive for cough. Negative for shortness of breath.   Gastrointestinal:  Negative for abdominal pain, nausea and vomiting.  Musculoskeletal:  Negative for arthralgias and myalgias.  Skin:  Negative for rash.  Neurological:  Negative for weakness and headaches.  Hematological:  Negative for adenopathy.    Physical Exam Triage Vital Signs ED Triage Vitals  Enc Vitals Group     BP  06/21/21 1628 (!) 153/88     Pulse Rate 06/21/21 1628 82     Resp 06/21/21 1628 18     Temp 06/21/21 1628 98.3 F (36.8 C)     Temp Source 06/21/21 1628 Oral     SpO2 06/21/21 1628 99 %     Weight 06/21/21 1626 162 lb 7.7 oz (73.7 kg)     Height 06/21/21 1626 5\' 3"  (1.6 m)     Head Circumference --      Peak Flow --      Pain Score 06/21/21 1625 5     Pain Loc --      Pain Edu? --      Excl. in GC? --    No data found.  Updated Vital Signs BP (!) 153/88 (BP Location: Right Arm)   Pulse 82   Temp 98.3 F (36.8 C) (Oral)   Resp 18   Ht 5\' 3"  (1.6 m)   Wt 162 lb 7.7 oz (73.7 kg)   LMP 10/17/2016   SpO2 99%   BMI 28.78 kg/m      Physical Exam Vitals and nursing note reviewed.  Constitutional:      General: She is not in acute distress.    Appearance: Normal appearance. She is not ill-appearing or toxic-appearing.  HENT:     Head: Normocephalic and atraumatic.     Nose: Congestion present.     Mouth/Throat:     Mouth: Mucous membranes are moist.     Pharynx: Oropharynx is clear.     Comments: +PND Eyes:     General: No scleral icterus.       Right eye: No discharge.        Left eye: No discharge.     Conjunctiva/sclera: Conjunctivae normal.  Cardiovascular:     Rate and Rhythm: Normal rate and regular rhythm.     Heart sounds: Normal heart sounds.  Pulmonary:     Effort: Pulmonary effort is normal. No respiratory distress.     Breath sounds: Normal breath sounds.  Musculoskeletal:     Cervical back: Neck supple.  Skin:    General: Skin is dry.  Neurological:     General: No focal deficit present.     Mental Status: She is alert. Mental status is at baseline.     Motor: No weakness.     Gait: Gait normal.  Psychiatric:        Mood and Affect: Mood normal.        Behavior: Behavior normal.  Thought Content: Thought content normal.     UC Treatments / Results  Labs (all labs ordered are listed, but only abnormal results are displayed) Labs  Reviewed  SARS CORONAVIRUS 2 (TAT 6-24 HRS)    EKG   Radiology No results found.  Procedures Procedures (including critical care time)  Medications Ordered in UC Medications - No data to display  Initial Impression / Assessment and Plan / UC Course  I have reviewed the triage vital signs and the nursing notes.  Pertinent labs & imaging results that were available during my care of the patient were reviewed by me and considered in my medical decision making (see chart for details).  55 year old female presenting for 2 to 3-day history of nasal congestion and cough as well as postnasal drainage.  2 negative at home COVID test.  Vitals are stable.  She is afebrile.  She is overall well-appearing.  Nasal congestion and postnasal drainage on exam.  The remainder the exam is normal.  PCR COVID test obtained.  Current CDC guidelines, isolation protocol and ED precautions reviewed with patient.  Supportive care advised.  Advised Mucinex DM and increasing fluid intake.  Reviewed return and ED precautions.  Offered work-up but she declined.  Final Clinical Impressions(s) / UC Diagnoses   Final diagnoses:  Viral upper respiratory tract infection  Acute cough  Nasal congestion     Discharge Instructions      URI/COLD SYMPTOMS: Your exam today is consistent with a viral illness. Antibiotics are not indicated at this time. Use medications as directed, including cough syrup (Mucinex DM), nasal saline, and decongestants. Your symptoms should improve over the next few days and resolve within 7-10 days. Increase rest and fluids. F/u if symptoms worsen or predominate such as sore throat, ear pain, productive cough, shortness of breath, or if you develop high fevers or worsening fatigue over the next several days.    You have received COVID testing today either for positive exposure, concerning symptoms that could be related to COVID infection, screening purposes, or re-testing after confirmed  positive.  Your test obtained today checks for active viral infection in the last 1-2 weeks. If your test is negative now, you can still test positive later. So, if you do develop symptoms you should either get re-tested and/or isolate x 5 days and then strict mask use x 5 days (unvaccinated) or mask use x 10 days (vaccinated). Please follow CDC guidelines.  While Rapid antigen tests come back in 15-20 minutes, send out PCR/molecular test results typically come back within 1-3 days. In the mean time, if you are symptomatic, assume this could be a positive test and treat/monitor yourself as if you do have COVID.   We will call with test results if positive. Please download the MyChart app and set up a profile to access test results.   If symptomatic, go home and rest. Push fluids. Take Tylenol as needed for discomfort. Gargle warm salt water. Throat lozenges. Take Mucinex DM or Robitussin for cough. Humidifier in bedroom to ease coughing. Warm showers. Also review the COVID handout for more information.  COVID-19 INFECTION: The incubation period of COVID-19 is approximately 14 days after exposure, with most symptoms developing in roughly 4-5 days. Symptoms may range in severity from mild to critically severe. Roughly 80% of those infected will have mild symptoms. People of any age may become infected with COVID-19 and have the ability to transmit the virus. The most common symptoms include: fever, fatigue, cough, body aches,  headaches, sore throat, nasal congestion, shortness of breath, nausea, vomiting, diarrhea, changes in smell and/or taste.    COURSE OF ILLNESS Some patients may begin with mild disease which can progress quickly into critical symptoms. If your symptoms are worsening please call ahead to the Emergency Department and proceed there for further treatment. Recovery time appears to be roughly 1-2 weeks for mild symptoms and 3-6 weeks for severe disease.   GO IMMEDIATELY TO ER FOR FEVER YOU  ARE UNABLE TO GET DOWN WITH TYLENOL, BREATHING PROBLEMS, CHEST PAIN, FATIGUE, LETHARGY, INABILITY TO EAT OR DRINK, ETC  QUARANTINE AND ISOLATION: To help decrease the spread of COVID-19 please remain isolated if you have COVID infection or are highly suspected to have COVID infection. This means -stay home and isolate to one room in the home if you live with others. Do not share a bed or bathroom with others while ill, sanitize and wipe down all countertops and keep common areas clean and disinfected. Stay home for 5 days. If you have no symptoms or your symptoms are resolving after 5 days, you can leave your house. Continue to wear a mask around others for 5 additional days. If you have been in close contact (within 6 feet) of someone diagnosed with COVID 19, you are advised to quarantine in your home for 14 days as symptoms can develop anywhere from 2-14 days after exposure to the virus. If you develop symptoms, you  must isolate.  Most current guidelines for COVID after exposure -unvaccinated: isolate 5 days and strict mask use x 5 days. Test on day 5 is possible -vaccinated: wear mask x 10 days if symptoms do not develop -You do not necessarily need to be tested for COVID if you have + exposure and  develop symptoms. Just isolate at home x10 days from symptom onset During this global pandemic, CDC advises to practice social distancing, try to stay at least 65ft away from others at all times. Wear a face covering. Wash and sanitize your hands regularly and avoid going anywhere that is not necessary.  KEEP IN MIND THAT THE COVID TEST IS NOT 100% ACCURATE AND YOU SHOULD STILL DO EVERYTHING TO PREVENT POTENTIAL SPREAD OF VIRUS TO OTHERS (WEAR MASK, WEAR GLOVES, WASH HANDS AND SANITIZE REGULARLY). IF INITIAL TEST IS NEGATIVE, THIS MAY NOT MEAN YOU ARE DEFINITELY NEGATIVE. MOST ACCURATE TESTING IS DONE 5-7 DAYS AFTER EXPOSURE.   It is not advised by CDC to get re-tested after receiving a positive COVID  test since you can still test positive for weeks to months after you have already cleared the virus.   *If you have not been vaccinated for COVID, I strongly suggest you consider getting vaccinated as long as there are no contraindications.       ED Prescriptions   None    PDMP not reviewed this encounter.   Shirlee Latch, PA-C 06/21/21 1723

## 2021-06-21 NOTE — Discharge Instructions (Addendum)
URI/COLD SYMPTOMS: Your exam today is consistent with a viral illness. Antibiotics are not indicated at this time. Use medications as directed, including cough syrup (Mucinex DM), nasal saline, and decongestants. Your symptoms should improve over the next few days and resolve within 7-10 days. Increase rest and fluids. F/u if symptoms worsen or predominate such as sore throat, ear pain, productive cough, shortness of breath, or if you develop high fevers or worsening fatigue over the next several days.    You have received COVID testing today either for positive exposure, concerning symptoms that could be related to COVID infection, screening purposes, or re-testing after confirmed positive.  Your test obtained today checks for active viral infection in the last 1-2 weeks. If your test is negative now, you can still test positive later. So, if you do develop symptoms you should either get re-tested and/or isolate x 5 days and then strict mask use x 5 days (unvaccinated) or mask use x 10 days (vaccinated). Please follow CDC guidelines.  While Rapid antigen tests come back in 15-20 minutes, send out PCR/molecular test results typically come back within 1-3 days. In the mean time, if you are symptomatic, assume this could be a positive test and treat/monitor yourself as if you do have COVID.   We will call with test results if positive. Please download the MyChart app and set up a profile to access test results.   If symptomatic, go home and rest. Push fluids. Take Tylenol as needed for discomfort. Gargle warm salt water. Throat lozenges. Take Mucinex DM or Robitussin for cough. Humidifier in bedroom to ease coughing. Warm showers. Also review the COVID handout for more information.  COVID-19 INFECTION: The incubation period of COVID-19 is approximately 14 days after exposure, with most symptoms developing in roughly 4-5 days. Symptoms may range in severity from mild to critically severe. Roughly 80% of  those infected will have mild symptoms. People of any age may become infected with COVID-19 and have the ability to transmit the virus. The most common symptoms include: fever, fatigue, cough, body aches, headaches, sore throat, nasal congestion, shortness of breath, nausea, vomiting, diarrhea, changes in smell and/or taste.    COURSE OF ILLNESS Some patients may begin with mild disease which can progress quickly into critical symptoms. If your symptoms are worsening please call ahead to the Emergency Department and proceed there for further treatment. Recovery time appears to be roughly 1-2 weeks for mild symptoms and 3-6 weeks for severe disease.   GO IMMEDIATELY TO ER FOR FEVER YOU ARE UNABLE TO GET DOWN WITH TYLENOL, BREATHING PROBLEMS, CHEST PAIN, FATIGUE, LETHARGY, INABILITY TO EAT OR DRINK, ETC  QUARANTINE AND ISOLATION: To help decrease the spread of COVID-19 please remain isolated if you have COVID infection or are highly suspected to have COVID infection. This means -stay home and isolate to one room in the home if you live with others. Do not share a bed or bathroom with others while ill, sanitize and wipe down all countertops and keep common areas clean and disinfected. Stay home for 5 days. If you have no symptoms or your symptoms are resolving after 5 days, you can leave your house. Continue to wear a mask around others for 5 additional days. If you have been in close contact (within 6 feet) of someone diagnosed with COVID 19, you are advised to quarantine in your home for 14 days as symptoms can develop anywhere from 2-14 days after exposure to the virus. If you develop symptoms,  you  must isolate.  Most current guidelines for COVID after exposure -unvaccinated: isolate 5 days and strict mask use x 5 days. Test on day 5 is possible -vaccinated: wear mask x 10 days if symptoms do not develop -You do not necessarily need to be tested for COVID if you have + exposure and  develop symptoms.  Just isolate at home x10 days from symptom onset During this global pandemic, CDC advises to practice social distancing, try to stay at least 6ft away from others at all times. Wear a face covering. Wash and sanitize your hands regularly and avoid going anywhere that is not necessary.  KEEP IN MIND THAT THE COVID TEST IS NOT 100% ACCURATE AND YOU SHOULD STILL DO EVERYTHING TO PREVENT POTENTIAL SPREAD OF VIRUS TO OTHERS (WEAR MASK, WEAR GLOVES, WASH HANDS AND SANITIZE REGULARLY). IF INITIAL TEST IS NEGATIVE, THIS MAY NOT MEAN YOU ARE DEFINITELY NEGATIVE. MOST ACCURATE TESTING IS DONE 5-7 DAYS AFTER EXPOSURE.   It is not advised by CDC to get re-tested after receiving a positive COVID test since you can still test positive for weeks to months after you have already cleared the virus.   *If you have not been vaccinated for COVID, I strongly suggest you consider getting vaccinated as long as there are no contraindications.   

## 2021-06-22 LAB — SARS CORONAVIRUS 2 (TAT 6-24 HRS): SARS Coronavirus 2: NEGATIVE

## 2022-03-31 ENCOUNTER — Other Ambulatory Visit: Payer: Self-pay | Admitting: Family Medicine

## 2022-03-31 DIAGNOSIS — E782 Mixed hyperlipidemia: Secondary | ICD-10-CM

## 2022-03-31 DIAGNOSIS — Z5181 Encounter for therapeutic drug level monitoring: Secondary | ICD-10-CM

## 2022-04-03 ENCOUNTER — Encounter: Payer: 59 | Admitting: Family Medicine

## 2022-04-07 ENCOUNTER — Telehealth: Payer: Self-pay | Admitting: Family Medicine

## 2022-04-07 DIAGNOSIS — Z0000 Encounter for general adult medical examination without abnormal findings: Secondary | ICD-10-CM

## 2022-04-07 NOTE — Telephone Encounter (Signed)
Pt has an appoinment on 04/25/22

## 2022-04-07 NOTE — Telephone Encounter (Unsigned)
Copied from CRM [id number]. Topic: General - Inquiry >> Apr 07, 2022  2:38 PM De Blanch wrote: Reason for CRM: Pt requested to have labs scheduled for CPE before her appointment. Pt stated she prefers to discuss her labs while at CPE appointment.  Please advise.

## 2022-04-10 NOTE — Telephone Encounter (Signed)
Pt has been notified with lab hours M-F from 8am-12pm or 2pm-4pm.

## 2022-04-13 ENCOUNTER — Encounter: Payer: Self-pay | Admitting: Family Medicine

## 2022-04-21 DIAGNOSIS — Z0000 Encounter for general adult medical examination without abnormal findings: Secondary | ICD-10-CM | POA: Diagnosis not present

## 2022-04-25 ENCOUNTER — Ambulatory Visit (INDEPENDENT_AMBULATORY_CARE_PROVIDER_SITE_OTHER): Payer: 59 | Admitting: Family Medicine

## 2022-04-25 ENCOUNTER — Encounter: Payer: Self-pay | Admitting: Family Medicine

## 2022-04-25 VITALS — BP 136/74 | HR 91 | Temp 98.4°F | Resp 16 | Ht 62.5 in | Wt 157.2 lb

## 2022-04-25 DIAGNOSIS — G4700 Insomnia, unspecified: Secondary | ICD-10-CM | POA: Diagnosis not present

## 2022-04-25 DIAGNOSIS — Z780 Asymptomatic menopausal state: Secondary | ICD-10-CM | POA: Diagnosis not present

## 2022-04-25 DIAGNOSIS — R7301 Impaired fasting glucose: Secondary | ICD-10-CM

## 2022-04-25 DIAGNOSIS — Z1231 Encounter for screening mammogram for malignant neoplasm of breast: Secondary | ICD-10-CM | POA: Diagnosis not present

## 2022-04-25 DIAGNOSIS — E782 Mixed hyperlipidemia: Secondary | ICD-10-CM

## 2022-04-25 DIAGNOSIS — Z5181 Encounter for therapeutic drug level monitoring: Secondary | ICD-10-CM | POA: Diagnosis not present

## 2022-04-25 DIAGNOSIS — D509 Iron deficiency anemia, unspecified: Secondary | ICD-10-CM | POA: Diagnosis not present

## 2022-04-25 DIAGNOSIS — Z23 Encounter for immunization: Secondary | ICD-10-CM

## 2022-04-25 DIAGNOSIS — Z5320 Procedure and treatment not carried out because of patient's decision for unspecified reasons: Secondary | ICD-10-CM

## 2022-04-25 DIAGNOSIS — Z0000 Encounter for general adult medical examination without abnormal findings: Secondary | ICD-10-CM

## 2022-04-25 MED ORDER — ATORVASTATIN CALCIUM 40 MG PO TABS: 40.0000 mg | ORAL_TABLET | Freq: Every day | ORAL | 3 refills | Status: DC

## 2022-04-25 MED ORDER — SHINGRIX 50 MCG/0.5ML IM SUSR: 0.5000 mL | Freq: Once | INTRAMUSCULAR | 1 refills | Status: AC

## 2022-04-25 NOTE — Assessment & Plan Note (Signed)
Last two years of iron panels have been normal, and stable Hgb levels low end of normal

## 2022-04-25 NOTE — Assessment & Plan Note (Signed)
Still having some hot flashes and sleep disturbances

## 2022-04-25 NOTE — Progress Notes (Signed)
Patient: Gail Harper, Female    DOB: [birth year], 56 y.o.   MRN: [id number] Delsa Grana, PA-C Visit Date: 04/25/2022  Today's Provider: Delsa Grana, PA-C   Chief Complaint  Patient presents with   Annual Exam   Subjective:   Annual physical exam:  Gail Harper is a 56 y.o. female who presents today for complete physical exam:  Exercise/Activity:  walks more during weekends 2 miles, during the week she walks 20 min per day  Diet/nutrition:  tries to eat healthy Sleep: not sleeping well - sometimes hot flashes, not sure why not sleeping as well - toss and turn  Tried a OTC sleep aid, helps her fall asleep but doesn't help her stay asleep She snores, no past sleep eval done, she state menopause sx have gotten overall better - not sure why she is having trouble sleeping  A1C add on -   Rashes - she will send mychart msg and we will do a subsequent visit to address this  Last year:  Exercise/Activity:  5 d a week 20 min Diet/nutrition:  healthy choices over all - nothing drastic Sleep: no concerns  SDOH Screenings   Alcohol Screen: Low Risk  (04/25/2022)   Alcohol Screen    Last Alcohol Screening Score (AUDIT): 0  Depression (PHQ2-9): Low Risk  (04/25/2022)   Depression (PHQ2-9)    PHQ-2 Score: 0  Financial Resource Strain: Low Risk  (04/25/2022)   Overall Financial Resource Strain (CARDIA)    Difficulty of Paying Living Expenses: Not hard at all  Food Insecurity: No Food Insecurity (04/25/2022)   Hunger Vital Sign    Worried About Running Out of Food in the Last Year: Never true    Ran Out of Food in the Last Year: Never true  Housing: Low Risk  (04/25/2022)   Housing    Last Housing Risk Score: 0  Physical Activity: Insufficiently Active (04/25/2022)   Exercise Vital Sign    Days of Exercise per Week: 1 day    Minutes of Exercise per Session: 80 min  Social Connections: Moderately Integrated (04/25/2022)   Social Connection and Isolation Panel [NHANES]     Frequency of Communication with Friends and Family: More than three times a week    Frequency of Social Gatherings with Friends and Family: More than three times a week    Attends Religious Services: 1 to 4 times per year    Active Member of [employer] or Organizations: Yes    Attends Archivist Meetings: More than 4 times per year    Marital Status: Widowed  Stress: No Stress Concern Present (04/25/2022)   [place]    Feeling of Stress : Not at all  Tobacco Use: Low Risk  (04/25/2022)   Patient History    Smoking Tobacco Use: Never    Smokeless Tobacco Use: Never    Passive Exposure: Not on file  Transportation Needs: No Transportation Needs (04/25/2022)   PRAPARE - Transportation    Lack of Transportation (Medical): No    Lack of Transportation (Non-Medical): No     We discuss acute and chronic complaints - insomnia, HLD management Advised pt of separate visit billing/coding  USPSTF grade A and B recommendations - reviewed and addressed today  Depression:  Phq 9 completed today by patient, was reviewed by me with patient in the room PHQ score is neg, pt feels good    04/25/2022   10:35 AM 04/01/2021  1:56 PM 12/21/2020    1:01 PM 03/17/2020   11:22 AM  PHQ 2/9 Scores  PHQ - 2 Score 0 0 0 0  PHQ- 9 Score 0 0  1      04/25/2022   10:35 AM 04/01/2021    1:56 PM 12/21/2020    1:01 PM 03/17/2020   11:22 AM 09/01/2019    7:49 AM  Depression screen PHQ 2/9  Decreased Interest 0 0 0 0 0  Down, Depressed, Hopeless 0 0 0 0 0  PHQ - 2 Score 0 0 0 0 0  Altered sleeping 0 0  1 0  Tired, decreased energy 0 0  0 0  Change in appetite 0 0  0 0  Feeling bad or failure about yourself  0 0  0 0  Trouble concentrating 0 0  0 0  Moving slowly or fidgety/restless 0 0  0 0  Suicidal thoughts 0 0  0 0  PHQ-9 Score 0 0  1 0  Difficult doing work/chores Not difficult at all Not difficult at all  Not difficult at all Not  difficult at all    Alcohol screening: [place] Office Visit from 04/25/2022 in [facility]  AUDIT-C Score 0       Immunizations and Health Maintenance: Health Maintenance  Topic Date Due   Zoster Vaccines- Shingrix (2 of 2) 06/14/2021   PAP SMEAR-Modifier  11/18/2021   INFLUENZA VACCINE  04/11/2022   MAMMOGRAM  06/07/2023   COLONOSCOPY (Pts 45-61yr Insurance coverage will need to be confirmed)  11/16/2024   TETANUS/TDAP  12/22/2030   COVID-19 Vaccine  Completed   Hepatitis C Screening  Completed   HIV Screening  Completed   HPV VACCINES  Aged Out    Hep C Screening: done  STD testing and prevention (HIV/chl/gon/syphilis):  see above, no additional testing desired by pt today  Intimate partner violence:  safe -   Sexual History/Pain during Intercourse: Widowed  Menstrual History/LMP/Abnormal Bleeding:  denies   Patient's last menstrual period was 10/17/2016.  Incontinence Symptoms:  none  Breast cancer: due Last Mammogram: *see HM list above BRCA gene screening:   Cervical cancer screening:  DUE - pt wishes to do with gyn Pt denies family hx of cancers - breast, ovarian, uterine, colon:     Osteoporosis:   Discussion on osteoporosis per age, including high calcium and vitamin D supplementation, weight bearing exercises Pt is supplementing with daily calcium/Vit D - womens one a day  No prior Bone scan/dexa Roughly experienced menopause at age   Skin cancer:  Hx of skin CA -  NO Discussed atypical lesions   Colorectal cancer:   Colonoscopy is UTD  Discussed concerning signs and sx of CRC, pt denies chagne in bowles blood in stool  Lung cancer:   Low Dose CT Chest recommended if Age 58-80years, 20 pack-year currently smoking OR have quit w/in 15years. Patient does not qualify.    Social History   Tobacco Use   Smoking status: Never   Smokeless tobacco: Never  Vaping Use   Vaping Use: Never used  Substance Use Topics   Alcohol  use: No   Drug use: No     Flowsheet Row Office Visit [facility] AUDIT-C Score 0       Family History  Problem Relation Age of Onset   Heart disease Sister    Kidney disease Sister    Polycystic kidney disease Sister  Stroke Mother    Kidney disease Mother    Polycystic kidney disease Mother    Cancer Father    Kidney disease Brother    Polycystic kidney disease Brother    Stroke Sister 50   Polycystic kidney disease Sister    Diabetes Neg Hx    Breast cancer Neg Hx      Blood pressure/Hypertension: BP Readings from Last 3 Encounters:  04/25/22 136/74  06/21/21 (!) 153/88  05/03/21 (!) 159/93    Weight/Obesity: Wt Readings from Last 3 Encounters:  04/25/22 157 lb 3.2 oz (71.3 kg)  06/21/21 162 lb 7.7 oz (73.7 kg)  05/03/21 162 lb 6.4 oz (73.7 kg)   BMI Readings from Last 3 Encounters:  04/25/22 28.29 kg/m  06/21/21 28.78 kg/m  05/03/21 28.77 kg/m     Lipids:  Lab Results  Component Value Date   CHOL 198 04/21/2022   CHOL 189 04/01/2021   CHOL 200 (H) 03/17/2020   Lab Results  Component Value Date   HDL 39 (L) 04/21/2022   HDL 39 (L) 04/01/2021   HDL 48 (L) 03/17/2020   Lab Results  Component Value Date   LDLCALC 127 (H) 04/21/2022   LDLCALC 113 (H) 04/01/2021   LDLCALC 120 (H) 03/17/2020   Lab Results  Component Value Date   TRIG 208 (H) 04/21/2022   TRIG 258 (H) 04/01/2021   TRIG 202 (H) 03/17/2020   Lab Results  Component Value Date   CHOLHDL 5.1 (H) 04/21/2022   CHOLHDL 4.8 04/01/2021   CHOLHDL 4.2 03/17/2020   No results found for: "LDLDIRECT" Based on the results of lipid panel his/her cardiovascular risk factor ( using [employer] )  in the next 10 years is: The 10-year ASCVD risk score (Arnett DK, et al., 2019) is: 6.6%   Values used to calculate the score:     Age: 45 years     Sex: Female     Is Non-Hispanic African American: Yes     Diabetic: No     Tobacco smoker: No      Systolic Blood Pressure: 956 mmHg     Is BP treated: No     HDL Cholesterol: 39 mg/dL     Total Cholesterol: 198 mg/dL  Glucose:  Glucose, Bld  Date Value Ref Range Status  04/21/2022 109 (H) 65 - 99 mg/dL Final    Comment:    .            Fasting reference interval . For someone without known diabetes, a glucose value between 100 and 125 mg/dL is consistent with prediabetes and should be confirmed with a follow-up test. .   04/01/2021 82 65 - 99 mg/dL Final    Comment:    .            Fasting reference interval .   03/17/2020 87 65 - 99 mg/dL Final    Comment:    .            Fasting reference interval .     Advanced Care Planning:  A voluntary discussion about advance care planning including the explanation and discussion of advance directives.     Social History       Social History   Socioeconomic History   Marital status: Widowed    Spouse name: Not on file   Number of children: 2   Years of education: Not on file   Highest education level: Not on file  Occupational History   Not  on file  Tobacco Use   Smoking status: Never   Smokeless tobacco: Never  Vaping Use   Vaping Use: Never used  Substance and Sexual Activity   Alcohol use: No   Drug use: No   Sexual activity: Not Currently    Partners: Male  Other Topics Concern   Not on file  Social History Narrative   Not on file   Social Determinants of Health   Financial Resource Strain: Low Risk  (04/25/2022)   Overall Financial Resource Strain (CARDIA)    Difficulty of Paying Living Expenses: Not hard at all  Food Insecurity: No Food Insecurity (04/25/2022)   Hunger Vital Sign    Worried About Running Out of Food in the Last Year: Never true    Ran Out of Food in the Last Year: Never true  Transportation Needs: No Transportation Needs (04/25/2022)   PRAPARE - Hydrologist (Medical): No    Lack of Transportation (Non-Medical): No  Physical Activity: Insufficiently  Active (04/25/2022)   Exercise Vital Sign    Days of Exercise per Week: 1 day    Minutes of Exercise per Session: 80 min  Stress: No Stress Concern Present (04/25/2022)   [place]    Feeling of Stress : Not at all  Social Connections: Moderately Integrated (04/25/2022)   Social Connection and Isolation Panel [NHANES]    Frequency of Communication with Friends and Family: More than three times a week    Frequency of Social Gatherings with Friends and Family: More than three times a week    Attends Religious Services: 1 to 4 times per year    Active Member of [employer] or Organizations: Yes    Attends Archivist Meetings: More than 4 times per year    Marital Status: Widowed    Family History        Family History  Problem Relation Age of Onset   Heart disease Sister    Kidney disease Sister    Polycystic kidney disease Sister    Stroke Mother    Kidney disease Mother    Polycystic kidney disease Mother    Cancer Father    Kidney disease Brother    Polycystic kidney disease Brother    Stroke Sister 14   Polycystic kidney disease Sister    Diabetes Neg Hx    Breast cancer Neg Hx     Patient Active Problem List   Diagnosis Date Noted   Iron deficiency anemia 03/17/2020   Family history of adult polycystic kidney disease 03/17/2020   Onychomycosis 03/17/2020   Overweight (BMI 25.0-29.9) 03/17/2020   Menopause present, declines hormone replacement therapy 10/30/2017   Ovarian cyst, left 11/04/2015   Abnormal uterine bleeding (AUB) 11/04/2015   Chronic neck pain 09/09/2014   HLD (hyperlipidemia) 09/09/2014   BP (high blood pressure) 09/09/2014    Past Surgical History:  Procedure Laterality Date   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     Current Outpatient Medications:    atorvastatin (LIPITOR) 10 MG tablet, TAKE 1 TABLET BY MOUTH EVERYDAY AT BEDTIME, Disp: 90 tablet, Rfl: 3   ciclopirox  (PENLAC) 8 % solution, Apply topically at bedtime. Apply over nail and surrounding skin. Apply daily over previous coat. After seven (7) days, may remove with alcohol and continue cycle., Disp: 6.6 mL, Rfl: 0   loratadine (CLARITIN) 10 MG tablet, Take 1 tablet (10 mg total) by mouth  daily., Disp: 90 tablet, Rfl: 3   meloxicam (MOBIC) 15 MG tablet, Take 1 tablet (15 mg total) by mouth daily as needed for pain., Disp: 90 tablet, Rfl: 3   Multiple Vitamins-Minerals (ONE-A-DAY WOMENS PO), Take by mouth., Disp: , Rfl:    triamcinolone cream (KENALOG) 0.1 %, Apply 1 application topically 2 (two) times daily as needed., Disp: 30 g, Rfl: 1  No Known Allergies  Patient Care Team: Delsa Grana, PA-C as PCP - General (Family Medicine)   Chart Review: I personally reviewed active problem list, medication list, allergies, family history, social history, health maintenance, notes from last encounter, lab results, imaging with the patient/caregiver today.   Review of Systems  Constitutional: Negative.   HENT: Negative.    Eyes: Negative.   Respiratory: Negative.    Cardiovascular: Negative.   Gastrointestinal: Negative.   Endocrine: Negative.   Genitourinary: Negative.   Musculoskeletal: Negative.   Skin: Negative.   Allergic/Immunologic: Negative.   Neurological: Negative.   Hematological: Negative.   Psychiatric/Behavioral: Negative.    All other systems reviewed and are negative.         Objective:   Vitals:  Vitals:   04/25/22 1045  BP: 136/74  Pulse: 91  Resp: 16  Temp: 98.4 F (36.9 C)  TempSrc: Oral  SpO2: 96%  Weight: 157 lb 3.2 oz (71.3 kg)  Height: 5' 2.5" (1.588 m)    Body mass index is 28.29 kg/m.  Physical Exam Vitals and nursing note reviewed.  Constitutional:      General: She is not in acute distress.    Appearance: Normal appearance. She is well-developed and overweight. She is not ill-appearing, toxic-appearing or diaphoretic.     Interventions: Face mask  in place.  HENT:     Head: Normocephalic and atraumatic.     Right Ear: External ear normal.     Left Ear: External ear normal.     Mouth/Throat:     Pharynx: Uvula midline.  Eyes:     General: Lids are normal. No scleral icterus.       Right eye: No discharge.        Left eye: No discharge.     Conjunctiva/sclera: Conjunctivae normal.  Neck:     Trachea: Phonation normal. No tracheal deviation.  Cardiovascular:     Rate and Rhythm: Normal rate and regular rhythm.     Pulses: Normal pulses.          Radial pulses are 2+ on the right side and 2+ on the left side.       Posterior tibial pulses are 2+ on the right side and 2+ on the left side.     Heart sounds: Normal heart sounds. No murmur heard.    No friction rub. No gallop.  Pulmonary:     Effort: Pulmonary effort is normal. No respiratory distress.     Breath sounds: Normal breath sounds. No stridor. No wheezing, rhonchi or rales.  Chest:     Chest wall: No tenderness.  Abdominal:     General: Bowel sounds are normal. There is no distension.     Palpations: Abdomen is soft.     Tenderness: There is no abdominal tenderness. There is no guarding or rebound.  Musculoskeletal:     Cervical back: Normal range of motion and neck supple. No rigidity.     Right lower leg: No edema.     Left lower leg: No edema.  Lymphadenopathy:     Cervical: No cervical adenopathy.  Skin:    General: Skin is warm and dry.     Capillary Refill: Capillary refill takes less than 2 seconds.     Coloration: Skin is not jaundiced or pale.     Findings: No rash.  Neurological:     Mental Status: She is alert. Mental status is at baseline.     Motor: No abnormal muscle tone.     Gait: Gait normal.  Psychiatric:        Mood and Affect: Mood normal.        Speech: Speech normal.        Behavior: Behavior normal. Behavior is cooperative.       Fall Risk:    04/25/2022   10:34 AM 04/01/2021    1:56 PM 12/21/2020    1:01 PM 03/17/2020   11:22 AM  09/01/2019    7:48 AM  [place] in the past year? 0 0 0 0 0  Number falls in past yr: 0 0 0 0 0  Injury with Fall? 0 0 0 1 0  Risk for fall due to : No Fall Risks      Follow up Falls prevention discussed;Education provided   Falls evaluation completed Falls evaluation completed    Functional Status Survey: Is the patient deaf or have difficulty hearing?: No Does the patient have difficulty seeing, even when wearing glasses/contacts?: No Does the patient have difficulty concentrating, remembering, or making decisions?: No Does the patient have difficulty walking or climbing stairs?: No Does the patient have difficulty dressing or bathing?: No Does the patient have difficulty doing errands alone such as visiting a doctor's office or shopping?: No   Assessment & Plan:    CPE completed today  USPSTF grade A and B recommendations reviewed with patient; age-appropriate recommendations, preventive care, screening tests, etc discussed and encouraged; healthy living encouraged; see AVS for patient education given to patient  Discussed importance of 150 minutes of physical activity weekly, AHA exercise recommendations given to pt in AVS/handout  Discussed importance of healthy diet:  eating lean meats and proteins, avoiding trans fats and saturated fats, avoid simple sugars and excessive carbs in diet, eat 6 servings of fruit/vegetables daily and drink plenty of water and avoid sweet beverages.    Recommended pt to do annual eye exam and routine dental exams/cleanings  Depression, alcohol, fall screening completed as documented above and per flowsheets  Advance Care planning information and packet discussed and offered today, encouraged pt to discuss with family members/spouse/partner/friends and complete Advanced directive packet and bring copy to office   Reviewed Health Maintenance: Health Maintenance  Topic Date Due   Zoster Vaccines- Shingrix (2 of 2) 06/14/2021   PAP  SMEAR-Modifier  11/18/2021   INFLUENZA VACCINE  04/11/2022   MAMMOGRAM  06/07/2023   COLONOSCOPY (Pts 45-18yr Insurance coverage will need to be confirmed)  11/16/2024   TETANUS/TDAP  12/22/2030   COVID-19 Vaccine  Completed   Hepatitis C Screening  Completed   HIV Screening  Completed   HPV VACCINES  Aged Out    Immunizations: Immunization History  Administered Date(s) Administered   Influenza Inj Mdck Quad With Preservative 07/06/2018   Influenza,inj,Quad PF,6+ Mos 06/07/2017, 06/27/2019, 08/13/2021   Influenza,inj,quad, With Preservative 07/15/2016   Influenza-Unspecified 07/10/2020   PFIZER(Purple Top)SARS-COV-2 Vaccination 12/22/2019, 01/13/2020, 09/09/2020   Pfizer Covid-19 Vaccine Bivalent Booster 113yr& up 08/13/2021   Tdap 12/21/2020   Zoster Recombinat (Shingrix) 04/19/2021   Vaccines:  HPV: up to at age  71 , ask insurance if age between 36-45  Shingrix: 67-64 yo and ask insurance if covered when patient above 28 yo Pneumonia:  educated and discussed with patient. Flu:  educated and discussed with patient. COVID:    Problem List Items Addressed This Visit       Other   HLD (hyperlipidemia)    Lipids poorly controlled and gradually worsening - she has been on lipitor 10 mg for 8+ years - lipids available through care everywhere in 2015 were well controlled and at goal. Discussed diet and lifestyle efforts - she has gained a little weight - reviewed how some weight loss and healthy diet can help  Recommend increasing lipitor dose to 40 mg or switch to crestor She preferred to increase med dose to 40 mg Will recheck lipids and lfts in 3-4 months and appt required if pt having SE/concerns      Relevant Medications   atorvastatin (LIPITOR) 40 MG tablet   Menopause present, declines hormone replacement therapy    Still having some hot flashes and sleep disturbances      Iron deficiency anemia    Last two years of iron panels have been normal, and stable Hgb  levels low end of normal      Insomnia    New over the past year OTC meds help her get to sleep but she cannot stay asleep, uncomfortable, tossing and turning, feeling hot Discussed that menopausal sx may be contributing - discussed trial of clonidine or effexor Reviewed sleep hygiene  Recommend sleep study to r/o OSA Reviewed other sleep med options       Other Visit Diagnoses     Annual physical exam    -  Primary   completed today   Need for shingles vaccine       Relevant Medications   Zoster Vaccine Adjuvanted [facility]) injection   Encounter for medication monitoring       all labs completed on 8/11, previously ordered by me for prior to CPE, reviewed thoroughly with pt today   Encounter for screening mammogram for malignant neoplasm of breast       Relevant Orders   MM 3D SCREEN BREAST BILATERAL   Elevated fasting glucose       fasting for labs, glucose mildly elevated, will add A1C to eval for DM or prediabetes   Relevant Orders   Hemoglobin A1c            Delsa Grana, PA-C 04/25/22 10:57 AM  [place]

## 2022-04-25 NOTE — Assessment & Plan Note (Signed)
Lipids poorly controlled and gradually worsening - she has been on lipitor 10 mg for 8+ years - lipids available through care everywhere in 2015 were well controlled and at goal. Discussed diet and lifestyle efforts - she has gained a little weight - reviewed how some weight loss and healthy diet can help  Recommend increasing lipitor dose to 40 mg or switch to crestor She preferred to increase med dose to 40 mg Will recheck lipids and lfts in 3-4 months and appt required if pt having SE/concerns

## 2022-04-25 NOTE — Assessment & Plan Note (Signed)
New over the past year OTC meds help her get to sleep but she cannot stay asleep, uncomfortable, tossing and turning, feeling hot Discussed that menopausal sx may be contributing - discussed trial of clonidine or effexor Reviewed sleep hygiene  Recommend sleep study to r/o OSA Reviewed other sleep med options

## 2022-04-25 NOTE — Patient Instructions (Addendum)
Health Maintenance  Topic Date Due   Zoster (Shingles) Vaccine (2 of 2) 06/14/2021   Pap Smear  11/18/2021   Flu Shot  04/11/2022   Mammogram  06/07/2023   Colon Cancer Screening  11/16/2024   Tetanus Vaccine  12/22/2030   COVID-19 Vaccine  Completed   Hepatitis C Screening: USPSTF Recommendation to screen - Ages 18-58 yo.  Completed   HIV Screening  Completed   HPV Vaccine  Aged Out   Results for orders placed or performed in visit on 04/07/22  CBC with Differential/Platelet  Result Value Ref Range   WBC 8.3 3.8 - 10.8 Thousand/uL   RBC 4.30 3.80 - 5.10 Million/uL   Hemoglobin 11.6 (L) 11.7 - 15.5 g/dL   HCT 35.2 35.0 - 45.0 %   MCV 81.9 80.0 - 100.0 fL   MCH 27.0 27.0 - 33.0 pg   MCHC 33.0 32.0 - 36.0 g/dL   RDW 13.4 11.0 - 15.0 %   Platelets 307 140 - 400 Thousand/uL   MPV 10.5 7.5 - 12.5 fL   Neutro Abs 4,947 1,500 - 7,800 cells/uL   Lymphs Abs 2,598 850 - 3,900 cells/uL   Absolute Monocytes 589 200 - 950 cells/uL   Eosinophils Absolute 116 15 - 500 cells/uL   Basophils Absolute 50 0 - 200 cells/uL   Neutrophils Relative % 59.6 %   Total Lymphocyte 31.3 %   Monocytes Relative 7.1 %   Eosinophils Relative 1.4 %   Basophils Relative 0.6 %  COMPLETE METABOLIC PANEL WITH GFR  Result Value Ref Range   Glucose, Bld 109 (H) 65 - 99 mg/dL   BUN 10 7 - 25 mg/dL   Creat 0.69 0.50 - 1.03 mg/dL   eGFR 101 > OR = 60 mL/min/1.86m2   BUN/Creatinine Ratio SEE NOTE: 6 - 22 (calc)   Sodium 141 135 - 146 mmol/L   Potassium 4.2 3.5 - 5.3 mmol/L   Chloride 104 98 - 110 mmol/L   CO2 29 20 - 32 mmol/L   Calcium 9.7 8.6 - 10.4 mg/dL   Total Protein 7.0 6.1 - 8.1 g/dL   Albumin 4.2 3.6 - 5.1 g/dL   Globulin 2.8 1.9 - 3.7 g/dL (calc)   AG Ratio 1.5 1.0 - 2.5 (calc)   Total Bilirubin 0.4 0.2 - 1.2 mg/dL   Alkaline phosphatase (APISO) 101 37 - 153 U/L   AST 15 10 - 35 U/L   ALT 16 6 - 29 U/L  Lipid panel  Result Value Ref Range   Cholesterol 198 <200 mg/dL   HDL 39 (L) > OR = 50  mg/dL   Triglycerides 208 (H) <150 mg/dL   LDL Cholesterol (Calc) 127 (H) mg/dL (calc)   Total CHOL/HDL Ratio 5.1 (H) <5.0 (calc)   Non-HDL Cholesterol (Calc) 159 (H) <130 mg/dL (calc)     Preventive Care 19-71 Years Old, Female Preventive care refers to lifestyle choices and visits with your health care provider that can promote health and wellness. Preventive care visits are also called wellness exams. What can I expect for my preventive care visit? Counseling Your health care provider may ask you questions about your: Medical history, including: Past medical problems. Family medical history. Pregnancy history. Current health, including: Menstrual cycle. Method of birth control. Emotional well-being. Home life and relationship well-being. Sexual activity and sexual health. Lifestyle, including: Alcohol, nicotine or tobacco, and drug use. Access to firearms. Diet, exercise, and sleep habits. Work and work Statistician. Sunscreen use. Safety issues such as seatbelt  and bike helmet use. Physical exam Your health care provider will check your: Height and weight. These may be used to calculate your BMI (body mass index). BMI is a measurement that tells if you are at a healthy weight. Waist circumference. This measures the distance around your waistline. This measurement also tells if you are at a healthy weight and may help predict your risk of certain diseases, such as type 2 diabetes and high blood pressure. Heart rate and blood pressure. Body temperature. Skin for abnormal spots. What immunizations do I need?  Vaccines are usually given at various ages, according to a schedule. Your health care provider will recommend vaccines for you based on your age, medical history, and lifestyle or other factors, such as travel or where you work. What tests do I need? Screening Your health care provider may recommend screening tests for certain conditions. This may include: Lipid and  cholesterol levels. Diabetes screening. This is done by checking your blood sugar (glucose) after you have not eaten for a while (fasting). Pelvic exam and Pap test. Hepatitis B test. Hepatitis C test. HIV (human immunodeficiency virus) test. STI (sexually transmitted infection) testing, if you are at risk. Lung cancer screening. Colorectal cancer screening. Mammogram. Talk with your health care provider about when you should start having regular mammograms. This may depend on whether you have a family history of breast cancer. BRCA-related cancer screening. This may be done if you have a family history of breast, ovarian, tubal, or peritoneal cancers. Bone density scan. This is done to screen for osteoporosis. Talk with your health care provider about your test results, treatment options, and if necessary, the need for more tests. Follow these instructions at home: Eating and drinking  Eat a diet that includes fresh fruits and vegetables, whole grains, lean protein, and low-fat dairy products. Take vitamin and mineral supplements as recommended by your health care provider. Do not drink alcohol if: Your health care provider tells you not to drink. You are pregnant, may be pregnant, or are planning to become pregnant. If you drink alcohol: Limit how much you have to 0-1 drink a day. Know how much alcohol is in your drink. In the [place] one drink equals one 12 oz bottle of beer (355 mL), one 5 oz glass of wine (148 mL), or one 1 oz glass of hard liquor (44 mL). Lifestyle Brush your teeth every morning and night with fluoride toothpaste. Floss one time each day. Exercise for at least 30 minutes 5 or more days each week. Do not use any products that contain nicotine or tobacco. These products include cigarettes, chewing tobacco, and vaping devices, such as e-cigarettes. If you need help quitting, ask your health care provider. Do not use drugs. If you are sexually active, practice safe sex.  Use a condom or other form of protection to prevent STIs. If you do not wish to become pregnant, use a form of birth control. If you plan to become pregnant, see your health care provider for a prepregnancy visit. Take aspirin only as told by your health care provider. Make sure that you understand how much to take and what form to take. Work with your health care provider to find out whether it is safe and beneficial for you to take aspirin daily. Find healthy ways to manage stress, such as: Meditation, yoga, or listening to music. Journaling. Talking to a trusted person. Spending time with friends and family. Minimize exposure to UV radiation to reduce your risk of  skin cancer. Safety Always wear your seat belt while driving or riding in a vehicle. Do not drive: If you have been drinking alcohol. Do not ride with someone who has been drinking. When you are tired or distracted. While texting. If you have been using any mind-altering substances or drugs. Wear a helmet and other protective equipment during sports activities. If you have firearms in your house, make sure you follow all gun safety procedures. Seek help if you have been physically or sexually abused. What's next? Visit your health care provider once a year for an annual wellness visit. Ask your health care provider how often you should have your eyes and teeth checked. Stay up to date on all vaccines. This information is not intended to replace advice given to you by your health care provider. Make sure you discuss any questions you have with your health care provider. Document Revised: 02/23/2021 Document Reviewed: 02/23/2021 Elsevier Patient Education  [place].

## 2022-04-26 LAB — LIPID PANEL
Cholesterol: 198 mg/dL (ref <200)
HDL: 39 mg/dL — ABNORMAL LOW (ref >=50)
LDL Cholesterol (Calc): 127 mg/dL (calc) — ABNORMAL HIGH
Non-HDL Cholesterol (Calc): 159 mg/dL (calc) — ABNORMAL HIGH (ref <130)
Total CHOL/HDL Ratio: 5.1 (calc) — ABNORMAL HIGH (ref <5.0)
Triglycerides: 208 mg/dL — ABNORMAL HIGH (ref <150)

## 2022-04-26 LAB — COMPLETE METABOLIC PANEL WITH GFR
AG Ratio: 1.5 (calc) (ref 1.0–2.5)
ALT: 16 U/L (ref 6–29)
AST: 15 U/L (ref 10–35)
Albumin: 4.2 g/dL (ref 3.6–5.1)
Alkaline phosphatase (APISO): 101 U/L (ref 37–153)
BUN: 10 mg/dL (ref 7–25)
CO2: 29 mmol/L (ref 20–32)
Calcium: 9.7 mg/dL (ref 8.6–10.4)
Chloride: 104 mmol/L (ref 98–110)
Creat: 0.69 mg/dL (ref 0.50–1.03)
Globulin: 2.8 g/dL (calc) (ref 1.9–3.7)
Glucose, Bld: 109 mg/dL — ABNORMAL HIGH (ref 65–99)
Potassium: 4.2 mmol/L (ref 3.5–5.3)
Sodium: 141 mmol/L (ref 135–146)
Total Bilirubin: 0.4 mg/dL (ref 0.2–1.2)
Total Protein: 7 g/dL (ref 6.1–8.1)
eGFR: 101 mL/min/{1.73_m2} (ref >=60)

## 2022-04-26 LAB — HEMOGLOBIN A1C
Hgb A1c MFr Bld: 6.1 % of total Hgb — ABNORMAL HIGH (ref <5.7)
Mean Plasma Glucose: 128 mg/dL
eAG (mmol/L): 7.1 mmol/L

## 2022-04-26 LAB — CBC WITH DIFFERENTIAL/PLATELET
Absolute Monocytes: 589 cells/uL (ref 200–950)
Basophils Absolute: 50 cells/uL (ref 0–200)
Basophils Relative: 0.6 %
Eosinophils Absolute: 116 cells/uL (ref 15–500)
Eosinophils Relative: 1.4 %
HCT: 35.2 % (ref 35.0–45.0)
Hemoglobin: 11.6 g/dL — ABNORMAL LOW (ref 11.7–15.5)
Lymphs Abs: 2598 cells/uL (ref 850–3900)
MCH: 27 pg (ref 27.0–33.0)
MCHC: 33 g/dL (ref 32.0–36.0)
MCV: 81.9 fL (ref 80.0–100.0)
MPV: 10.5 fL (ref 7.5–12.5)
Monocytes Relative: 7.1 %
Neutro Abs: 4947 cells/uL (ref 1500–7800)
Neutrophils Relative %: 59.6 %
Platelets: 307 10*3/uL (ref 140–400)
RBC: 4.3 10*6/uL (ref 3.80–5.10)
RDW: 13.4 % (ref 11.0–15.0)
Total Lymphocyte: 31.3 %
WBC: 8.3 10*3/uL (ref 3.8–10.8)

## 2022-04-26 LAB — TEST AUTHORIZATION

## 2022-05-19 ENCOUNTER — Telehealth: Payer: 59 | Admitting: Family Medicine

## 2022-05-25 ENCOUNTER — Encounter: Payer: Self-pay | Admitting: Family Medicine

## 2022-05-26 ENCOUNTER — Telehealth (INDEPENDENT_AMBULATORY_CARE_PROVIDER_SITE_OTHER): Payer: 59 | Admitting: Family Medicine

## 2022-05-26 DIAGNOSIS — R7303 Prediabetes: Secondary | ICD-10-CM

## 2022-05-26 DIAGNOSIS — L83 Acanthosis nigricans: Secondary | ICD-10-CM | POA: Diagnosis not present

## 2022-05-26 DIAGNOSIS — R21 Rash and other nonspecific skin eruption: Secondary | ICD-10-CM

## 2022-05-26 MED ORDER — TRIAMCINOLONE ACETONIDE 0.1 % EX CREA: 1.0000 | TOPICAL_CREAM | Freq: Two times a day (BID) | CUTANEOUS | 1 refills | Status: DC | PRN

## 2022-05-26 NOTE — Patient Instructions (Signed)
Acanthosis Nigricans Acanthosis nigricans is a condition in which dark, velvety markings appear on the skin. What are the causes? This condition may be caused by: A hormonal or glandular disorder, such as diabetes. Obesity. Certain medicines, such as birth control pills. A tumor. This is rare. Some people inherit the condition from their parents. What increases the risk? You are more likely to develop this condition if you: Have a hormonal or glandular disorder. Are overweight. Take certain medicines. Have certain cancers, especially stomach cancer. Have dark-colored skin (dark complexion). What are the signs or symptoms? The main symptom of this condition is velvety markings on the skin that are light brown, black, or grayish in color. The markings usually appear on the face. They may also appear in skin fold areas at the neck, armpits, inner thighs, and groin. In severe cases, markings may also appear on the lips, hands, breasts, eyelids, and mouth. How is this diagnosed? This condition may be diagnosed based on your symptoms. A skin sample may be removed for testing (skin biopsy). You may also have tests to help determine the cause of the condition. How is this treated? Treatment for this condition depends on the cause. Treatment may involve reducing insulin levels, which are often high in people who have this condition. Insulin levels can be reduced with: Dietary changes, such as avoiding starchy foods and sugars. Losing weight. Medicines. Sometimes, treatment involves: Medicines to improve the appearance of the skin. Laser treatment to improve the appearance of the skin. Surgical removal of the skin markings (dermabrasion). Follow these instructions at home: Follow diet instructions from your health care provider. Lose weight if you are overweight. Take over-the-counter and prescription medicines only as told by your health care provider. Keep all follow-up visits as told by  your health care provider. This is important. Contact a health care provider if: New skin markings develop on a part of the body where they rarely develop, such as on your lips, hands, breasts, eyelids, or mouth. The condition recurs for an unknown reason. Summary Acanthosis nigricans is a condition in which dark, velvety markings appear on the skin. Treatment for this condition depends on the cause. Treatment may include dietary changes, medicines, laser treatment, or surgery. Take over-the-counter and prescription medicines only as told by your health care provider. Contact a health care provider if new skin markings develop on a part of the body where they rarely develop, such as on your lips, hands, breasts, eyelids, or mouth. Keep all follow-up visits as told by your health care provider. This is important. This information is not intended to replace advice given to you by your health care provider. Make sure you discuss any questions you have with your health care provider. Document Revised: 11/02/2021 Document Reviewed: 06/23/2021 Elsevier Patient Education  2023 [employer] Inc.  

## 2022-05-26 NOTE — Progress Notes (Signed)
Name: Gail Harper   MRN: [id number]    DOB: [birth year]   Date:05/26/2022       Progress Note  Subjective:    Chief Complaint  Chief Complaint  Patient presents with   Rash    Onset for 4-6 months around neck/bilateral legs. Occasionally itches unable to provide vital signs    I connected with  Rozann Lesches Tappan  on 05/26/22 at 10:40 AM EDT by a video enabled telemedicine application and verified that I am speaking with the correct person using two identifiers.  I discussed the limitations of evaluation and management by telemedicine and the availability of in person appointments. The patient expressed understanding and agreed to proceed. Staff also discussed with the patient that there may be a patient responsible charge related to this service. Patient Location: home Provider Location: [facility] clinic  Additional Individuals present: none  HPI Connected briefly on video but froze and no audio  11:19 AM Called pt on telephone no answer Visit for rash She sent images yesterday - pictures of back of neck and LE Neck appears consistent with Acanthosis nigricans Some flat appearing hyperpigmented areas on what appears to be ankles with nearby reticular veins  11:22 AM 4th call on the phone after telling her on video again that we have a poor connection and I cannot hear her at all  Finally connected She has concerns about skin changes and reported rash to the back of her neck, axilla and some spots behind her knees She sent in pictures through MyChart message yesterday which were reviewed with her today during an encounter also reviewed her prior office visits and messages over the past couple months regarding the same         The texture of the back of her neck does feel slightly different and thickened sometimes it is itchy, she reports hyperpigmentation to her armpit she noticed after using a deodorant that she thought was irritating her it improved a little bit after she  stopped using that deodorant but has gotten darker again The back of her legs she has these flat hyperpigmented spots that she reports were never raised dry or scaly, did not itch, she previously used steroids to treat them she was instructed by Dr. Carlynn Purl to stop using steroids if they are not itchy so she has not used the prior steroid creams and they have gradually lightened over the past couple months  Of note with recent CPE A1C was elevated, new prediabetes  We have f/up in a few months to recheck  Lab Results  Component Value Date   HGBA1C 6.1 (H) 04/21/2022      Patient Active Problem List   Diagnosis Date Noted   Insomnia 04/25/2022   Iron deficiency anemia 03/17/2020   Family history of adult polycystic kidney disease 03/17/2020   Overweight (BMI 25.0-29.9) 03/17/2020   Menopause present, declines hormone replacement therapy 10/30/2017   Ovarian cyst, left 11/04/2015   Chronic neck pain 09/09/2014   HLD (hyperlipidemia) 09/09/2014    Social History   Tobacco Use   Smoking status: Never   Smokeless tobacco: Never  Substance Use Topics   Alcohol use: No     Current Outpatient Medications:    atorvastatin (LIPITOR) 40 MG tablet, Take 1 tablet (40 mg total) by mouth daily., Disp: 90 tablet, Rfl: 3   ciclopirox (PENLAC) 8 % solution, Apply topically at bedtime. Apply over nail and surrounding skin. Apply daily over previous coat. After seven (7) days,  may remove with alcohol and continue cycle., Disp: 6.6 mL, Rfl: 0   loratadine (CLARITIN) 10 MG tablet, Take 1 tablet (10 mg total) by mouth daily., Disp: 90 tablet, Rfl: 3   meloxicam (MOBIC) 15 MG tablet, Take 1 tablet (15 mg total) by mouth daily as needed for pain., Disp: 90 tablet, Rfl: 3   Multiple Vitamins-Minerals (ONE-A-DAY WOMENS PO), Take by mouth., Disp: , Rfl:    triamcinolone cream (KENALOG) 0.1 %, Apply 1 application topically 2 (two) times daily as needed., Disp: 30 g, Rfl: 1  No Known  Allergies  REVIEW: I personally reviewed active problem list, medication list, allergies, family history, social history, health maintenance, notes from last encounter, lab results, imaging with the patient/caregiver today.   Review of Systems  Constitutional: Negative.   HENT: Negative.    Eyes: Negative.   Respiratory: Negative.    Cardiovascular: Negative.   Gastrointestinal: Negative.   Endocrine: Negative.   Genitourinary: Negative.   Musculoskeletal: Negative.   Skin: Negative.   Allergic/Immunologic: Negative.   Neurological: Negative.   Hematological: Negative.   Psychiatric/Behavioral: Negative.    All other systems reviewed and are negative.   Objective:   Virtual encounter, vitals limited, only able to obtain the following There were no vitals filed for this visit. There is no height or weight on file to calculate BMI. Nursing Note and Vital Signs reviewed.  Physical Exam Vitals and nursing note reviewed.  Constitutional:      General: She is not in acute distress.    Appearance: She is not ill-appearing or toxic-appearing.  Pulmonary:     Effort: Pulmonary effort is normal. No respiratory distress.  Skin:    Comments: Lesions and "rash" per photos Thinkened velvety area to posterior neck skin fold Scattered flat irregularly shaped areas to the back of legs  Neurological:     Mental Status: She is alert.  Psychiatric:        Mood and Affect: Mood normal.     PE limited by virtual encounter  No results found for this or any previous visit (from the past 72 hour(s)).  Assessment and Plan:     ICD-10-CM   1. Acanthosis nigricans  L83    discussed likely dx, explained it can be associated with prediabetes, dm and insulin resistance, can work on diet/avoid simple sugars    2. Rash  R21 triamcinolone cream (KENALOG) 0.1 %   unclear if areas to legs are acanthosis nigricans or other, getting better gradually on their own, not raised or itchy, monitor      3. Prediabetes  R73.03    last A1C 6.0, new prediabetes, discussed management with diet/lifestyle, we have f/up appt in Dec     Pt asked if she can put something on neck if it itchy - she can do cortisone 10 if she is having irritation or itching if that does not work she can use the triamcinolone cream but we did review the long-term side effects of repeated steroid use on the skin I did send her information about acanthosis nigricans and we discussed other possible treatments as noted on uptodate  Working on prediabetes may help skin   If there is any progression of the lesions to her legs we can see her again in office or refer her to dermatology ([facility] would be good) but I suspect any provider who saw her neck and axilla would likely agree its acanthosis nigricans - discussed with Dr. Carlynn Purl who also looked at  the images and agreed - she advised the same - work on addressing underlying cause and she noted sometimes the skin will improve   - I discussed the assessment and treatment plan with the patient. The patient was provided an opportunity to ask questions and all were answered. The patient agreed with the plan and demonstrated an understanding of the instructions.  I provided 18+ minutes of non-face-to-face time during this encounter.  Danelle Berry, PA-C 05/26/22 11:18 AM

## 2022-06-14 ENCOUNTER — Ambulatory Visit
Admission: RE | Admit: 2022-06-14 | Discharge: 2022-06-14 | Disposition: A | Discharge status: Home / Self Care | Payer: 59 | Source: Ambulatory Visit | Attending: Family Medicine | Admitting: Family Medicine

## 2022-06-14 DIAGNOSIS — Z1231 Encounter for screening mammogram for malignant neoplasm of breast: Secondary | ICD-10-CM

## 2022-07-05 IMAGING — MG DIGITAL SCREENING BILAT W/ TOMO W/ CAD
8 series · 8 of 24 positions shown · non-contrast
Comparison: Previous exam(s).

CLINICAL DATA: Screening.

EXAM:
DIGITAL SCREENING BILATERAL MAMMOGRAM WITH TOMO AND CAD

[L CC synth-2D]
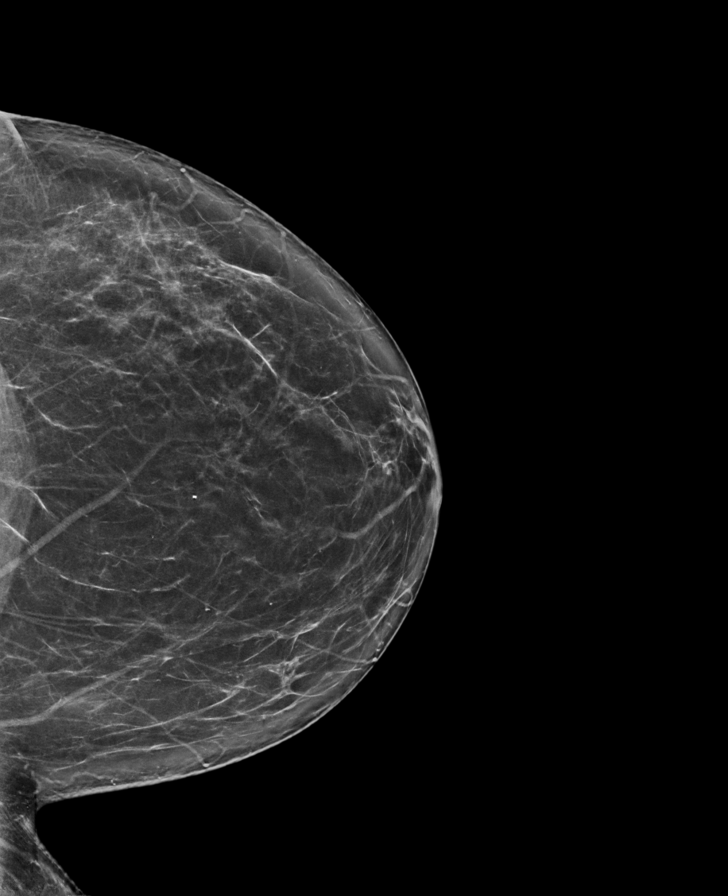

[R CC synth-2D]
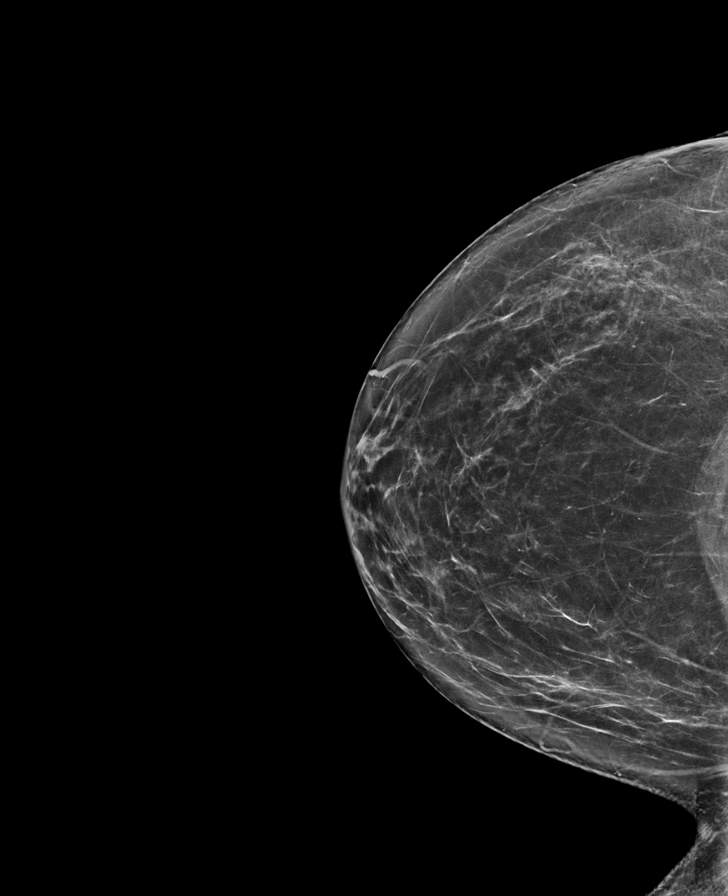

[R MLO synth-2D]
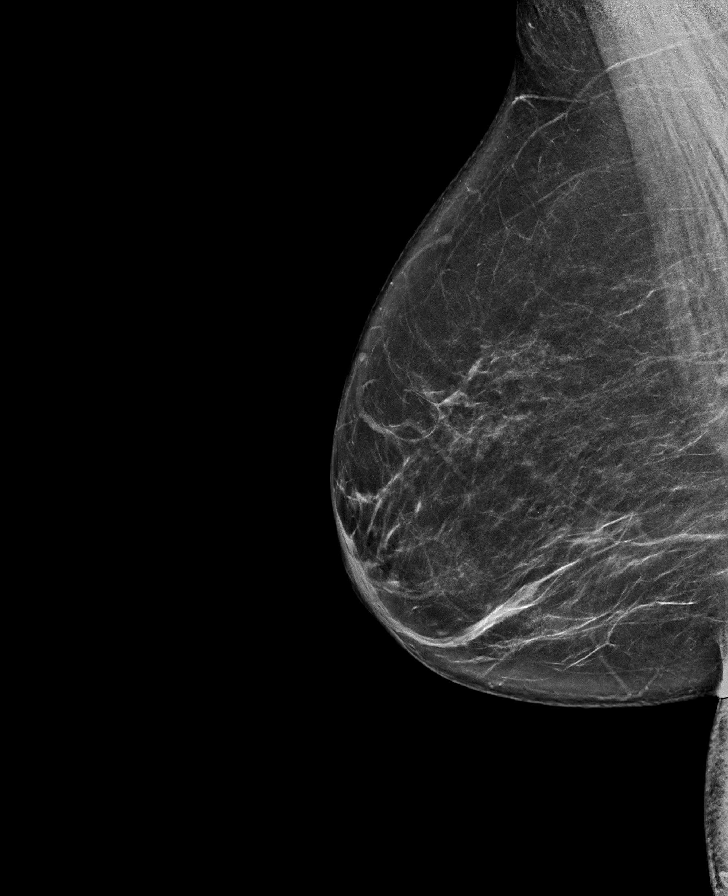

[L MLO synth-2D]
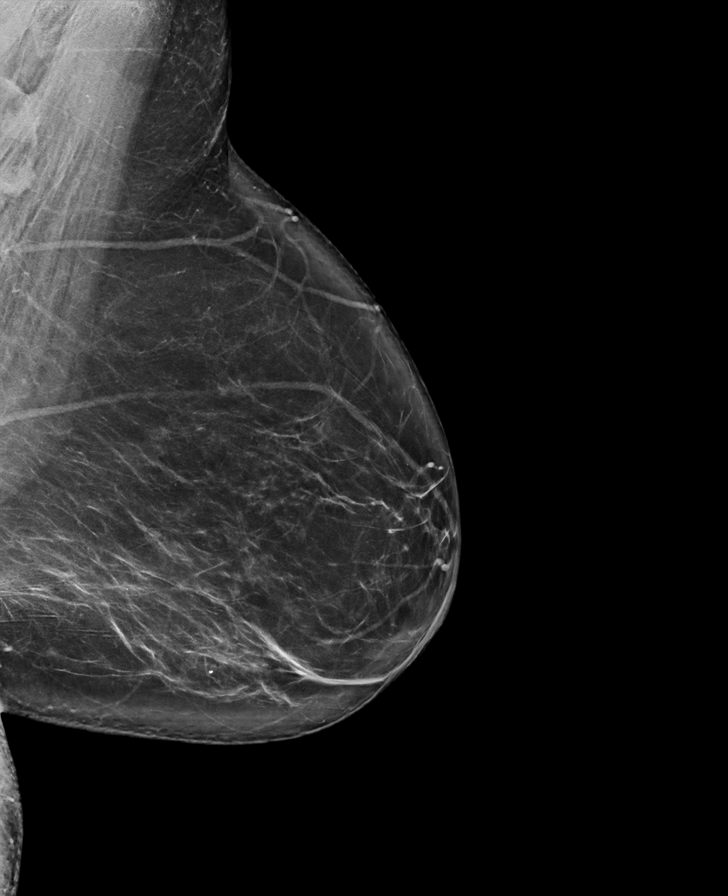

[L CC tomo · tomo slice 39/77.0]
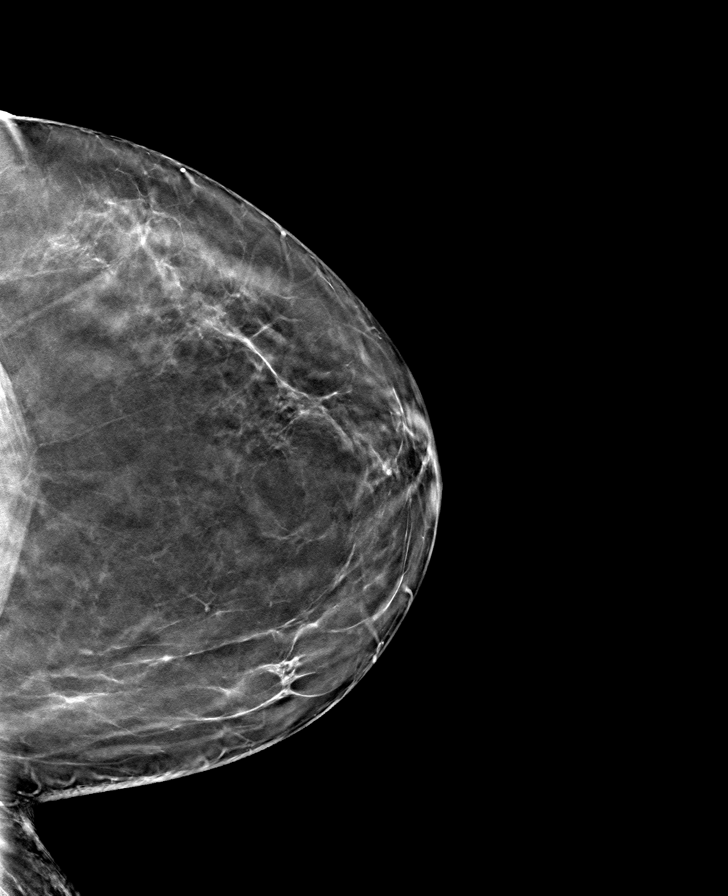

[R CC tomo · tomo slice 38/75.0]
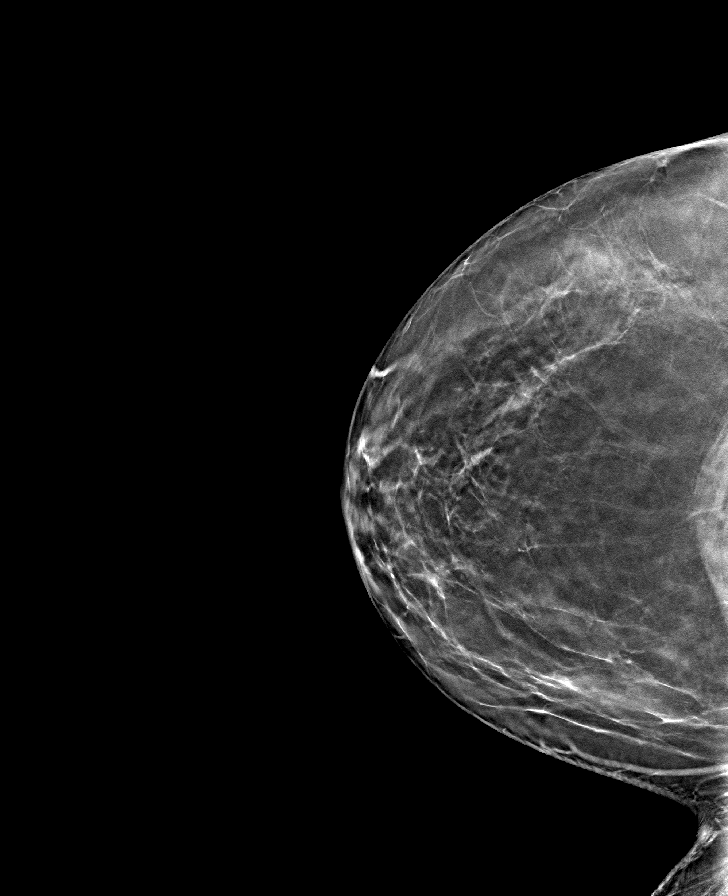

[L MLO tomo · tomo slice 48/95.0]
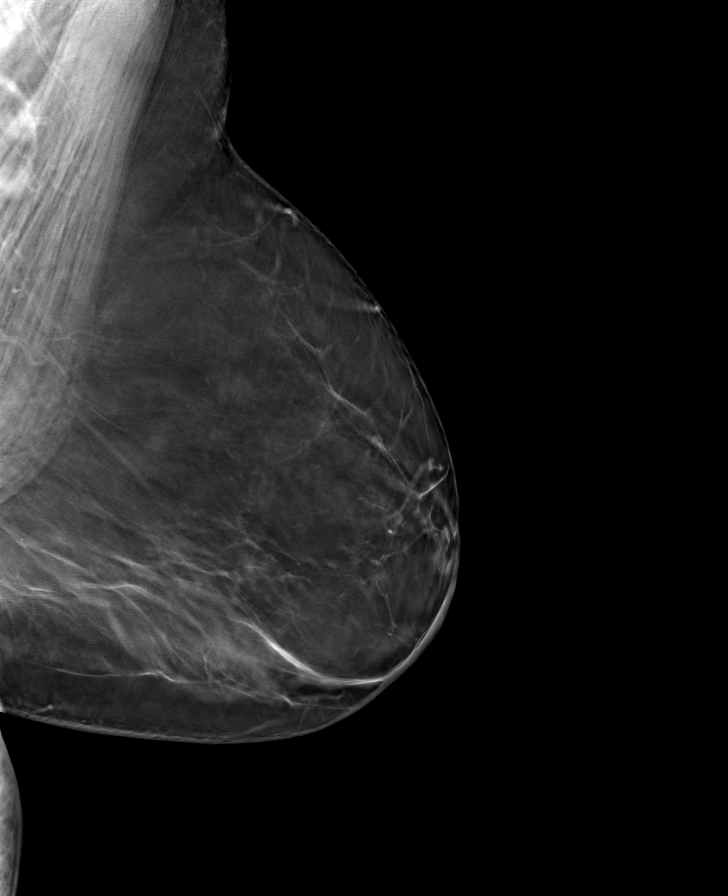

[R MLO tomo · tomo slice 43/85.0]
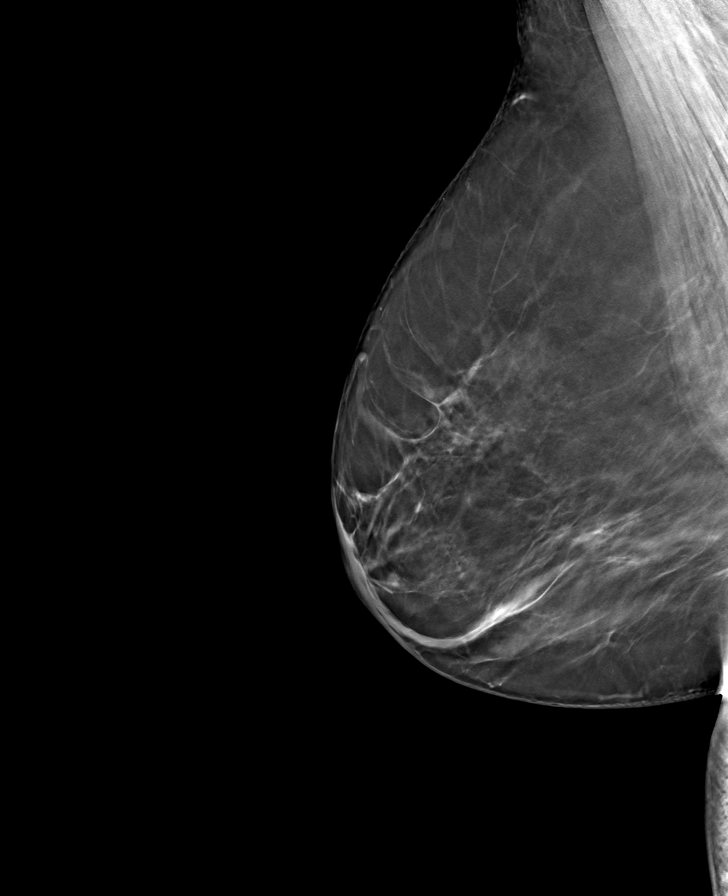

[8 of 24 positions shown; findings below may reference images not displayed]

ACR Breast Density Category b: There are scattered areas of
fibroglandular density.
FINDINGS: There are no findings suspicious for malignancy. Images were
processed with CAD.
IMPRESSION: No mammographic evidence of malignancy. A result letter of this
screening mammogram will be mailed directly to the patient.

RECOMMENDATION:
Screening mammogram in one year. (Code:[id number])

BI-RADS CATEGORY  1: Negative.

## 2022-07-11 NOTE — Patient Instructions (Incomplete)
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
## 2022-07-12 ENCOUNTER — Telehealth: Payer: Self-pay

## 2022-07-12 ENCOUNTER — Ambulatory Visit: Payer: 59 | Admitting: Obstetrics and Gynecology

## 2022-07-12 DIAGNOSIS — Z01419 Encounter for gynecological examination (general) (routine) without abnormal findings: Secondary | ICD-10-CM

## 2022-07-12 DIAGNOSIS — Z124 Encounter for screening for malignant neoplasm of cervix: Secondary | ICD-10-CM

## 2022-07-12 NOTE — Telephone Encounter (Signed)
I contacted patient via phone. I asked patient to log into mychart to confirm next available appointment or call back.
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
## 2022-08-16 ENCOUNTER — Encounter: Payer: Self-pay | Admitting: Obstetrics and Gynecology

## 2022-08-16 ENCOUNTER — Other Ambulatory Visit (HOSPITAL_COMMUNITY)
Admission: RE | Admit: 2022-08-16 | Discharge: 2022-08-16 | Disposition: A | Discharge status: Home / Self Care | Payer: 59 | Source: Ambulatory Visit | Attending: Obstetrics and Gynecology | Admitting: Obstetrics and Gynecology

## 2022-08-16 ENCOUNTER — Ambulatory Visit (INDEPENDENT_AMBULATORY_CARE_PROVIDER_SITE_OTHER): Payer: 59 | Admitting: Obstetrics and Gynecology

## 2022-08-16 VITALS — BP 142/82 | HR 86 | Resp 16 | Ht 63.0 in | Wt 155.9 lb

## 2022-08-16 DIAGNOSIS — E663 Overweight: Secondary | ICD-10-CM

## 2022-08-16 DIAGNOSIS — Z01419 Encounter for gynecological examination (general) (routine) without abnormal findings: Secondary | ICD-10-CM | POA: Diagnosis not present

## 2022-08-16 DIAGNOSIS — N951 Menopausal and female climacteric states: Secondary | ICD-10-CM | POA: Diagnosis not present

## 2022-08-16 DIAGNOSIS — Z124 Encounter for screening for malignant neoplasm of cervix: Secondary | ICD-10-CM | POA: Diagnosis not present

## 2022-08-16 DIAGNOSIS — E785 Hyperlipidemia, unspecified: Secondary | ICD-10-CM

## 2022-08-16 DIAGNOSIS — R7303 Prediabetes: Secondary | ICD-10-CM

## 2022-08-16 HISTORY — DX: Prediabetes: R73.03

## 2022-08-18 LAB — CYTOLOGY - PAP
Comment: NEGATIVE
Diagnosis: NEGATIVE
High risk HPV: NEGATIVE

## 2022-08-25 ENCOUNTER — Ambulatory Visit: Payer: 59 | Admitting: Family Medicine

## 2022-09-05 ENCOUNTER — Ambulatory Visit: Payer: 59 | Admitting: Family Medicine

## 2022-09-19 ENCOUNTER — Encounter: Payer: Self-pay | Admitting: Physician Assistant

## 2022-09-19 ENCOUNTER — Other Ambulatory Visit: Payer: Self-pay | Admitting: Physician Assistant

## 2022-09-19 ENCOUNTER — Ambulatory Visit: Payer: 59 | Admitting: Physician Assistant

## 2022-09-19 VITALS — BP 134/76 | HR 77 | Temp 98.5°F | Resp 16 | Ht 63.0 in | Wt 155.9 lb

## 2022-09-19 DIAGNOSIS — E782 Mixed hyperlipidemia: Secondary | ICD-10-CM

## 2022-09-19 NOTE — Assessment & Plan Note (Signed)
Chronic, ongoing  Patient has been taking her Rosuvastatin 40 mg PO QD and appears to be tolerating well She denies concerns for SE at this time  Recheck lipid panel today for monitoring The 10-year ASCVD risk score (Arnett DK, et al., 2019) is: 6.3%   Values used to calculate the score:     Age: 57 years     Sex: Female     Is Non-Hispanic African American: Yes     Diabetic: No     Tobacco smoker: No     Systolic Blood Pressure: 353 mmHg     Is BP treated: No     HDL Cholesterol: 39 mg/dL     Total Cholesterol: 198 mg/dL Continue current regimen  Follow up in 6 months pending concerns or as indicated by labs

## 2022-09-19 NOTE — Progress Notes (Signed)
Established Patient Office Visit  Name: Gail Harper   MRN: [id number]    DOB: 01-17-1966   Date:09/19/2022  Today's Provider: Jacquelin Hawking, MHS, PA-C Introduced myself to the patient as a PA-C and provided education on APPs in clinical practice.         Subjective  Chief Complaint  Chief Complaint  Patient presents with   Follow-up   Hyperlipidemia    Hyperlipidemia Pertinent negatives include no chest pain, myalgias or shortness of breath.    HYPERLIPIDEMIA She thinks she is tolerating the Atorvastatin 40 mg PO QD well  Denies side effects at this time  Hyperlipidemia status: good compliance Satisfied with current treatment?  yes Side effects:  no Medication compliance: good compliance Past cholesterol meds: atorvastain (lipitor)  Supplements: none Aspirin:  no The 10-year ASCVD risk score (Arnett DK, et al., 2019) is: 6.3%   Values used to calculate the score:     Age: 57 years     Sex: Female     Is Non-Hispanic African American: Yes     Diabetic: No     Tobacco smoker: No     Systolic Blood Pressure: 134 mmHg     Is BP treated: No     HDL Cholesterol: 39 mg/dL     Total Cholesterol: 198 mg/dL Chest pain:  no Coronary artery disease:  no Family history CAD:  no Family history early CAD:  no   Patient Active Problem List   Diagnosis Date Noted   Prediabetes 08/16/2022   Insomnia 04/25/2022   Iron deficiency anemia 03/17/2020   Family history of adult polycystic kidney disease 03/17/2020   Overweight (BMI 25.0-29.9) 03/17/2020   Menopause present, declines hormone replacement therapy 10/30/2017   Ovarian cyst, left 11/04/2015   Chronic neck pain 09/09/2014   HLD (hyperlipidemia) 09/09/2014    Past Surgical History:  Procedure Laterality Date   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    Family History  Problem Relation Age of Onset   Heart disease Sister    Kidney disease Sister    Polycystic kidney disease Sister    Stroke Mother     Kidney disease Mother    Polycystic kidney disease Mother    Cancer Father    Kidney disease Brother    Polycystic kidney disease Brother    Stroke Sister 35   Polycystic kidney disease Sister    Diabetes Neg Hx    Breast cancer Neg Hx     Social History   Tobacco Use   Smoking status: Never   Smokeless tobacco: Never  Substance Use Topics   Alcohol use: No     Current Outpatient Medications:    atorvastatin (LIPITOR) 40 MG tablet, Take 1 tablet (40 mg total) by mouth daily., Disp: 90 tablet, Rfl: 3   ciclopirox (PENLAC) 8 % solution, Apply topically at bedtime. Apply over nail and surrounding skin. Apply daily over previous coat. After seven (7) days, may remove with alcohol and continue cycle., Disp: 6.6 mL, Rfl: 0   loratadine (CLARITIN) 10 MG tablet, Take 1 tablet (10 mg total) by mouth daily., Disp: 90 tablet, Rfl: 3   meloxicam (MOBIC) 15 MG tablet, Take 1 tablet (15 mg total) by mouth daily as needed for pain., Disp: 90 tablet, Rfl: 3   Multiple Vitamins-Minerals (ONE-A-DAY WOMENS PO), Take by mouth., Disp: , Rfl:    triamcinolone cream (KENALOG) 0.1 %, Apply 1 Application topically 2 (  two) times daily as needed., Disp: 30 g, Rfl: 1  No Known Allergies  I personally reviewed active problem list, medication list, allergies, notes from last encounter, lab results with the patient/caregiver today.   Review of Systems  Eyes:  Negative for blurred vision and double vision.  Respiratory:  Negative for shortness of breath and wheezing.   Cardiovascular:  Negative for chest pain, palpitations and leg swelling.  Gastrointestinal:  Negative for diarrhea, heartburn, nausea and vomiting.  Musculoskeletal:  Negative for myalgias.  Neurological:  Negative for dizziness and headaches.      Objective  Vitals:   09/19/22 1112  BP: 134/76  Pulse: 77  Resp: 16  Temp: 98.5 F (36.9 C)  TempSrc: Oral  SpO2: 98%  Weight: 155 lb 14.4 oz (70.7 kg)  Height: 5\' 3"  (1.6 m)     Body mass index is 27.62 kg/m.  Physical Exam Vitals reviewed.  Constitutional:      General: She is awake.     Appearance: Normal appearance. She is well-developed and well-groomed.  HENT:     Head: Normocephalic and atraumatic.  Eyes:     Extraocular Movements: Extraocular movements intact.     Conjunctiva/sclera: Conjunctivae normal.  Cardiovascular:     Rate and Rhythm: Normal rate and regular rhythm.     Pulses: Normal pulses.          Radial pulses are 2+ on the right side and 2+ on the left side.     Heart sounds: Normal heart sounds. No murmur heard.    No friction rub. No gallop.  Pulmonary:     Effort: Pulmonary effort is normal. No respiratory distress.     Breath sounds: Normal breath sounds. No wheezing, rhonchi or rales.  Musculoskeletal:     Cervical back: Normal range of motion.     Right lower leg: No edema.     Left lower leg: No edema.  Neurological:     General: No focal deficit present.     Mental Status: She is alert and oriented to person, place, and time. Mental status is at baseline.  Psychiatric:        Mood and Affect: Mood normal.        Behavior: Behavior normal. Behavior is cooperative.        Thought Content: Thought content normal.        Judgment: Judgment normal.      Recent Results (from the past 2160 hour(s))  Cytology - PAP     Status: None   Collection Time: 08/16/22  8:39 AM  Result Value Ref Range   High risk HPV Negative    Adequacy      Satisfactory for evaluation; transformation zone component PRESENT.   Diagnosis      - Negative for intraepithelial lesion or malignancy (NILM)   Comment Normal Reference Range HPV - Negative      PHQ2/9:    09/19/2022   11:11 AM 05/26/2022    9:56 AM 04/25/2022   10:35 AM 04/01/2021    1:56 PM 12/21/2020    1:01 PM  Depression screen PHQ 2/9  Decreased Interest 0 0 0 0 0  Down, Depressed, Hopeless 0 0 0 0 0  PHQ - 2 Score 0 0 0 0 0  Altered sleeping 0 0 0 0   Tired, decreased  energy 0 0 0 0   Change in appetite 0 0 0 0   Feeling bad or failure about yourself  0 0 0  0   Trouble concentrating 0 0 0 0   Moving slowly or fidgety/restless 0 0 0 0   Suicidal thoughts 0 0 0 0   PHQ-9 Score 0 0 0 0   Difficult doing work/chores Not difficult at all Not difficult at all Not difficult at all Not difficult at all       Fall Risk:    09/19/2022   11:11 AM 05/26/2022    9:56 AM 04/25/2022   10:34 AM 04/01/2021    1:56 PM 12/21/2020    1:01 PM  [place] in the past year? 0 0 0 0 0  Number falls in past yr: 0 0 0 0 0  Injury with Fall? 0 0 0 0 0  Risk for fall due to : No Fall Risks No Fall Risks No Fall Risks    Follow up Falls prevention discussed;Education provided;Falls evaluation completed Falls prevention discussed;Education provided Falls prevention discussed;Education provided        Functional Status Survey: Is the patient deaf or have difficulty hearing?: No Does the patient have difficulty seeing, even when wearing glasses/contacts?: No Does the patient have difficulty concentrating, remembering, or making decisions?: No Does the patient have difficulty walking or climbing stairs?: No Does the patient have difficulty dressing or bathing?: No Does the patient have difficulty doing errands alone such as visiting a doctor's office or shopping?: No    Assessment & Plan  Problem List Items Addressed This Visit       Other   HLD (hyperlipidemia) - Primary    Chronic, ongoing  Patient has been taking her Rosuvastatin 40 mg PO QD and appears to be tolerating well She denies concerns for SE at this time  Recheck lipid panel today for monitoring The 10-year ASCVD risk score (Arnett DK, et al., 2019) is: 6.3%   Values used to calculate the score:     Age: 34 years     Sex: Female     Is Non-Hispanic African American: Yes     Diabetic: No     Tobacco smoker: No     Systolic Blood Pressure: 416 mmHg     Is BP treated: No     HDL Cholesterol:  39 mg/dL     Total Cholesterol: 198 mg/dL Continue current regimen  Follow up in 6 months pending concerns or as indicated by labs        Relevant Orders   Lipid Profile   COMPLETE METABOLIC PANEL WITH GFR   CBC w/Diff/Platelet     Return in about 6 months (around 03/20/2023).   I, Caden Fatica E Shemeca Lukasik, PA-C, have reviewed all documentation for this visit. The documentation on 09/19/22 for the exam, diagnosis, procedures, and orders are all accurate and complete.   Talitha Givens, MHS, PA-C [facility]

## 2022-09-20 LAB — COMPLETE METABOLIC PANEL WITH GFR
AG Ratio: 1.6 (calc) (ref 1.0–2.5)
ALT: 22 U/L (ref 6–29)
AST: 16 U/L (ref 10–35)
Albumin: 4.4 g/dL (ref 3.6–5.1)
Alkaline phosphatase (APISO): 106 U/L (ref 37–153)
BUN: 9 mg/dL (ref 7–25)
CO2: 28 mmol/L (ref 20–32)
Calcium: 10 mg/dL (ref 8.6–10.4)
Chloride: 102 mmol/L (ref 98–110)
Creat: 0.61 mg/dL (ref 0.50–1.03)
Globulin: 2.8 g/dL (calc) (ref 1.9–3.7)
Glucose, Bld: 88 mg/dL (ref 65–99)
Potassium: 4 mmol/L (ref 3.5–5.3)
Sodium: 141 mmol/L (ref 135–146)
Total Bilirubin: 0.5 mg/dL (ref 0.2–1.2)
Total Protein: 7.2 g/dL (ref 6.1–8.1)
eGFR: 104 mL/min/{1.73_m2} (ref >=60)

## 2022-09-20 LAB — CBC WITH DIFFERENTIAL/PLATELET
Absolute Monocytes: 579 cells/uL (ref 200–950)
Basophils Absolute: 27 cells/uL (ref 0–200)
Basophils Relative: 0.3 %
Eosinophils Absolute: 80 cells/uL (ref 15–500)
Eosinophils Relative: 0.9 %
HCT: 35 % (ref 35.0–45.0)
Hemoglobin: 11.5 g/dL — ABNORMAL LOW (ref 11.7–15.5)
Lymphs Abs: 3631 cells/uL (ref 850–3900)
MCH: 26.6 pg — ABNORMAL LOW (ref 27.0–33.0)
MCHC: 32.9 g/dL (ref 32.0–36.0)
MCV: 80.8 fL (ref 80.0–100.0)
MPV: 10.7 fL (ref 7.5–12.5)
Monocytes Relative: 6.5 %
Neutro Abs: 4584 cells/uL (ref 1500–7800)
Neutrophils Relative %: 51.5 %
Platelets: 309 10*3/uL (ref 140–400)
RBC: 4.33 10*6/uL (ref 3.80–5.10)
RDW: 13 % (ref 11.0–15.0)
Total Lymphocyte: 40.8 %
WBC: 8.9 10*3/uL (ref 3.8–10.8)

## 2022-09-20 LAB — LIPID PANEL
Cholesterol: 152 mg/dL (ref <200)
HDL: 36 mg/dL — ABNORMAL LOW (ref >=50)
LDL Cholesterol (Calc): 84 mg/dL (calc)
Non-HDL Cholesterol (Calc): 116 mg/dL (calc) (ref <130)
Total CHOL/HDL Ratio: 4.2 (calc) (ref <5.0)
Triglycerides: 218 mg/dL — ABNORMAL HIGH (ref <150)

## 2022-12-21 ENCOUNTER — Ambulatory Visit
Admission: RE | Admit: 2022-12-21 | Discharge: 2022-12-21 | Disposition: A | Discharge status: Home / Self Care | Payer: 59 | Attending: Family Medicine | Admitting: Family Medicine

## 2022-12-21 ENCOUNTER — Ambulatory Visit
Admission: RE | Admit: 2022-12-21 | Discharge: 2022-12-21 | Disposition: A | Discharge status: Home / Self Care | Payer: 59 | Source: Ambulatory Visit | Attending: Family Medicine | Admitting: Family Medicine

## 2022-12-21 ENCOUNTER — Encounter: Payer: Self-pay | Admitting: Family Medicine

## 2022-12-21 ENCOUNTER — Ambulatory Visit: Payer: 59 | Admitting: Family Medicine

## 2022-12-21 VITALS — BP 138/82 | HR 82 | Temp 97.8°F | Resp 16 | Ht 63.0 in | Wt 160.0 lb

## 2022-12-21 DIAGNOSIS — M238X1 Other internal derangements of right knee: Secondary | ICD-10-CM | POA: Diagnosis not present

## 2022-12-21 DIAGNOSIS — M546 Pain in thoracic spine: Secondary | ICD-10-CM

## 2022-12-21 MED ORDER — MELOXICAM 15 MG PO TABS
15.0000 mg | ORAL_TABLET | Freq: Every day | ORAL | 3 refills | Status: DC | PRN
Start: 2022-12-21 — End: 2024-01-18

## 2022-12-21 NOTE — Patient Instructions (Signed)
[facility] [address] [place],  [address] Phone: [phone] [email or web address]   

## 2022-12-21 NOTE — Progress Notes (Signed)
Patient ID: Gail Harper, female    DOB: 05/13/1966, 57 y.o.   MRN: [id number]  PCP: Danelle Berryapia, Dionel Archey, PA-C  Chief Complaint  Patient presents with   Edema    Right knee, has noticed a pop sound as she moves her knee.    discomfort    Right back side onset 3/4 months for the last week been persistent    Subjective:   Gail DecampLydia Ann Oliveria is a 57 y.o. 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 she states back then she was having chest wall pain shoulder and neck pain which led to finding this mass She is also concerned about right knee popping and clicking and grinding sound with some very mild swelling when comparing her right knee to her left but she is not having any pain, redness, difficulty with mobility Sometimes she will irritate her knee if she sleeps in the recliner or has it in a certain position but it eventually goes away this has not gone away  Patient Active Problem List   Diagnosis Date Noted   Prediabetes 08/16/2022   Insomnia 04/25/2022   Iron deficiency anemia 03/17/2020   Family history of adult polycystic kidney disease 03/17/2020   Overweight (BMI 25.0-29.9) 03/17/2020   Menopause present, declines hormone replacement therapy 10/30/2017   Ovarian cyst, left 11/04/2015   Chronic neck pain 09/09/2014   HLD (hyperlipidemia) 09/09/2014      Current Outpatient Medications:    atorvastatin (LIPITOR) 40 MG tablet, Take 1 tablet (40 mg total) by mouth daily., Disp: 90 tablet, Rfl: 3   ciclopirox (PENLAC) 8 % solution, Apply topically at bedtime. Apply over nail and surrounding skin. Apply daily over  previous coat. After seven (7) days, may remove with alcohol and continue cycle., Disp: 6.6 mL, Rfl: 0   loratadine (CLARITIN) 10 MG tablet, Take 1 tablet (10 mg total) by mouth daily., Disp: 90 tablet, Rfl: 3   meloxicam (MOBIC) 15 MG tablet, Take 1 tablet (15 mg total) by mouth daily as needed for pain., Disp: 90 tablet, Rfl: 3   Multiple Vitamins-Minerals (ONE-A-DAY WOMENS PO), Take by mouth., Disp: , Rfl:    triamcinolone cream (KENALOG) 0.1 %, Apply 1 Application topically 2 (two) times daily as needed., Disp: 30 g, Rfl: 1   No Known Allergies   Social History   Tobacco Use   Smoking status: Never   Smokeless tobacco: Never  Vaping Use   Vaping Use: Never used  Substance Use Topics   Alcohol use: No   Drug use: No      Chart Review Today: I personally reviewed active problem list, medication list, allergies, family history, social history, health maintenance, notes from last encounter, lab results, imaging with the patient/caregiver today.   Review of Systems  Constitutional: Negative.   HENT: Negative.    Eyes: Negative.   Respiratory: Negative.    Cardiovascular: Negative.   Gastrointestinal: Negative.   Endocrine: Negative.   Genitourinary: Negative.   Musculoskeletal: Negative.   Skin: Negative.   Allergic/Immunologic: Negative.   Neurological: Negative.   Hematological: Negative.   Psychiatric/Behavioral: Negative.  All other systems reviewed and are negative.      Objective:   Vitals:   12/21/22 0911  BP: 138/82  Pulse: 82  Resp: 16  Temp: 97.8 F (36.6 C)  TempSrc: Oral  SpO2: 98%  Weight: 160 lb (72.6 kg)  Height: 5\' 3"  (1.6 m)    Body mass index is 28.34 kg/m.  Physical Exam Vitals and nursing note reviewed.  Constitutional:      General: She is not in acute distress.    Appearance: Normal appearance. She is well-developed. She is not ill-appearing, toxic-appearing or diaphoretic.  HENT:     Head: Normocephalic and atraumatic.      Nose: Nose normal.  Eyes:     General:        Right eye: No discharge.        Left eye: No discharge.     Conjunctiva/sclera: Conjunctivae normal.  Neck:     Trachea: No tracheal deviation.  Cardiovascular:     Rate and Rhythm: Normal rate and regular rhythm.  Pulmonary:     Effort: Pulmonary effort is normal. No respiratory distress.     Breath sounds: No stridor.  Musculoskeletal:        General: Normal range of motion.     Right shoulder: No deformity, tenderness, bony tenderness or crepitus. Normal pulse.     Cervical back: Normal. Normal range of motion.     Thoracic back: No swelling, edema, spasms, tenderness or bony tenderness. Normal range of motion.     Left knee: Normal. No swelling, deformity, effusion, erythema, bony tenderness or crepitus. Normal range of motion. No tenderness. Normal alignment, normal meniscus and normal patellar mobility. Normal pulse.     Comments: No midline tenderness from cervical to lumbar spine, no step off No paraspinal muscle ttp from cervical to lumbar spine  Right scapula manipulated and palpated w/o tenderness Grossly normal ROM of back   Skin:    General: Skin is warm and dry.     Findings: No rash.  Neurological:     Mental Status: She is alert.     Motor: No abnormal muscle tone.     Coordination: Coordination normal.  Psychiatric:        Behavior: Behavior normal.      Results for orders placed or performed in visit on 09/19/22  Lipid panel  Result Value Ref Range   Cholesterol 152 <200 mg/dL   HDL 36 (L) > OR = 50 mg/dL   Triglycerides 678 (H) <150 mg/dL   LDL Cholesterol (Calc) 84 mg/dL (calc)   Total CHOL/HDL Ratio 4.2 <5.0 (calc)   Non-HDL Cholesterol (Calc) 116 <130 mg/dL (calc)  COMPLETE METABOLIC PANEL WITH GFR  Result Value Ref Range   Glucose, Bld 88 65 - 99 mg/dL   BUN 9 7 - 25 mg/dL   Creat 9.38 1.01 - 7.51 mg/dL   eGFR 025 > OR = 60 EN/IDP/8.24M3   BUN/Creatinine Ratio SEE NOTE: 6 - 22 (calc)   Sodium 141  135 - 146 mmol/L   Potassium 4.0 3.5 - 5.3 mmol/L   Chloride 102 98 - 110 mmol/L   CO2 28 20 - 32 mmol/L   Calcium 10.0 8.6 - 10.4 mg/dL   Total Protein 7.2 6.1 - 8.1 g/dL   Albumin 4.4 3.6 - 5.1 g/dL   Globulin 2.8 1.9 - 3.7 g/dL (calc)   AG Ratio 1.6 1.0 - 2.5 (calc)   Total Bilirubin 0.5 0.2 - 1.2 mg/dL   Alkaline  phosphatase (APISO) 106 37 - 153 U/L   AST 16 10 - 35 U/L   ALT 22 6 - 29 U/L  CBC with Differential/Platelet  Result Value Ref Range   WBC 8.9 3.8 - 10.8 Thousand/uL   RBC 4.33 3.80 - 5.10 Million/uL   Hemoglobin 11.5 (L) 11.7 - 15.5 g/dL   HCT 29.5 62.1 - 30.8 %   MCV 80.8 80.0 - 100.0 fL   MCH 26.6 (L) 27.0 - 33.0 pg   MCHC 32.9 32.0 - 36.0 g/dL   RDW 65.7 84.6 - 96.2 %   Platelets 309 140 - 400 Thousand/uL   MPV 10.7 7.5 - 12.5 fL   Neutro Abs 4,584 1,500 - 7,800 cells/uL   Lymphs Abs 3,631 850 - 3,900 cells/uL   Absolute Monocytes 579 200 - 950 cells/uL   Eosinophils Absolute 80 15 - 500 cells/uL   Basophils Absolute 27 0 - 200 cells/uL   Neutrophils Relative % 51.5 %   Total Lymphocyte 40.8 %   Monocytes Relative 6.5 %   Eosinophils Relative 0.9 %   Basophils Relative 0.3 %       Assessment & Plan:   1. 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  Dispense: 90 tablet; Refill: 3  2. Crepitus of joint of  right knee Intermittent popping clicking and grinding description in her right knee with no injury, no pain, no change in range of motion or mobility she reports very subtle edema which is hard to appreciate on exam there is no effusion Knee exam was unremarkable Patient states again that she has waited and rested for several months hoping it would resolve and because it has not improved that is why she came in today I explained that Ortho could evaluate her further with a better focused exam and possibly x-rays, but without much pain or impact on her life they may not do any big interventions or offer treatment different from what I am recommending today which is NSAIDs resting compression sleeves if needed.  She has hx of right hip pain in the past that occurred with some strenuous activity and resolved after short period of time this may be similar to that -currently no effusion, no crepitus on exam no instability  - meloxicam (MOBIC) 15 MG tablet; Take 1 tablet (15 mg total) by mouth daily as needed for pain.  Dispense: 90 tablet; Refill: 3 - Ambulatory referral to Orthopedics   she was given info to [facility] for contacting them if she would like to  Encouraged her to f/up in 2-4 weeks about her back/scapula area pain -if anything has progressed she may need to come into the office in person for reexamination if symptoms are persistent but stable we can discuss through MyChart messages and see if physical therapy referral would be needed at that  time   Danelle Berry, PA-C 12/21/22 9:28 AM

## 2022-12-25 DIAGNOSIS — M1711 Unilateral primary osteoarthritis, right knee: Secondary | ICD-10-CM | POA: Diagnosis not present

## 2023-03-26 ENCOUNTER — Ambulatory Visit: Payer: 59 | Admitting: Family Medicine

## 2023-05-13 ENCOUNTER — Other Ambulatory Visit: Payer: Self-pay | Admitting: Family Medicine

## 2023-05-13 DIAGNOSIS — E782 Mixed hyperlipidemia: Secondary | ICD-10-CM

## 2023-05-23 ENCOUNTER — Other Ambulatory Visit: Payer: Self-pay | Admitting: Family Medicine

## 2023-05-23 DIAGNOSIS — Z1231 Encounter for screening mammogram for malignant neoplasm of breast: Secondary | ICD-10-CM

## 2023-06-05 ENCOUNTER — Other Ambulatory Visit: Payer: Self-pay | Admitting: Family Medicine

## 2023-06-05 ENCOUNTER — Encounter: Payer: Self-pay | Admitting: Family Medicine

## 2023-06-05 DIAGNOSIS — R21 Rash and other nonspecific skin eruption: Secondary | ICD-10-CM

## 2023-06-06 ENCOUNTER — Other Ambulatory Visit: Payer: Self-pay

## 2023-06-06 DIAGNOSIS — R21 Rash and other nonspecific skin eruption: Secondary | ICD-10-CM

## 2023-06-06 MED ORDER — TRIAMCINOLONE ACETONIDE 0.1 % EX CREA
1.0000 | TOPICAL_CREAM | Freq: Two times a day (BID) | CUTANEOUS | 1 refills | Status: AC | PRN
Start: 2023-06-06 — End: ?

## 2023-06-11 ENCOUNTER — Encounter: Payer: Self-pay | Admitting: Physician Assistant

## 2023-06-11 ENCOUNTER — Ambulatory Visit: Payer: 59 | Admitting: Family Medicine

## 2023-06-11 ENCOUNTER — Ambulatory Visit: Payer: 59 | Admitting: Physician Assistant

## 2023-06-11 VITALS — BP 132/82 | HR 79 | Temp 97.9°F | Resp 16 | Ht 63.0 in | Wt 156.9 lb

## 2023-06-11 DIAGNOSIS — E782 Mixed hyperlipidemia: Secondary | ICD-10-CM | POA: Diagnosis not present

## 2023-06-11 DIAGNOSIS — R7303 Prediabetes: Secondary | ICD-10-CM | POA: Diagnosis not present

## 2023-06-11 DIAGNOSIS — R21 Rash and other nonspecific skin eruption: Secondary | ICD-10-CM | POA: Diagnosis not present

## 2023-06-11 MED ORDER — NYSTATIN 100000 UNIT/GM EX CREA
1.0000 | TOPICAL_CREAM | Freq: Two times a day (BID) | CUTANEOUS | 0 refills | Status: AC
Start: 2023-06-11 — End: ?

## 2023-06-11 NOTE — Progress Notes (Signed)
Acute Office Visit   Patient: Gail Harper   DOB: 06-May-1966   57 y.o. Female  MRN: [id number] Visit Date: 06/11/2023  Today's healthcare provider: Oswaldo Conroy Kelley Polinsky, PA-C  Introduced myself to the patient as a Secondary school teacher and provided education on APPs in clinical practice.    Chief Complaint  Patient presents with   Rash    Left breast x2 months   Back Pain    Right sided thoracic   Knee Pain    Right    Subjective    HPI HPI     Rash    Additional comments: Left breast x2 months        Back Pain    Additional comments: Right sided thoracic        Knee Pain    Additional comments: Right       Last edited by Benay Pike, CMA on 06/11/2023 12:46 PM.        Back Pain/ Knee pain (right) She reports the back pain has improved She has been taking Meloxicam and Tylenol and reports relief Pain level: 2/10 currently  She reports using a knee sleeve that has provided some support and relief of knee discomfort    RASH Duration:  months - about 2 months ago  Initially thought it was from new bra  Location: trunk - left breast  Itching: yes- initially  Burning: no Redness: no Oozing: no Scaling: no Blisters: no Painful: no Fevers: no Change in detergents/soaps/personal care products: no Recent illness: no Recent travel:no History of same: no Context: stable- but still there  Alleviating factors:  Anti-itch cream  Treatments attempted: Anti-itch cream  Shortness of breath: no  Throat/tongue swelling: no Myalgias/arthralgias: yes    Medications: Outpatient Medications Prior to Visit  Medication Sig   acetaminophen (TYLENOL) 325 MG tablet Take 650 mg by mouth every 6 (six) hours as needed for moderate pain.   atorvastatin (LIPITOR) 40 MG tablet TAKE 1 TABLET BY MOUTH EVERY DAY   loratadine (CLARITIN) 10 MG tablet Take 1 tablet (10 mg total) by mouth daily.   meloxicam (MOBIC) 15 MG tablet Take 1 tablet (15 mg total) by mouth daily as needed  for pain.   Multiple Vitamins-Minerals (ONE-A-DAY WOMENS PO) Take by mouth.   triamcinolone cream (KENALOG) 0.1 % Apply 1 Application topically 2 (two) times daily as needed.   ciclopirox (PENLAC) 8 % solution Apply topically at bedtime. Apply over nail and surrounding skin. Apply daily over previous coat. After seven (7) days, may remove with alcohol and continue cycle. (Patient not taking: Reported on 06/11/2023)   No facility-administered medications prior to visit.    Review of Systems  Musculoskeletal:  Positive for back pain.  Skin:  Positive for rash.        Objective    BP 132/82 (BP Location: Right Arm, Patient Position: Sitting, Cuff Size: Normal)   Pulse 79   Temp 97.9 F (36.6 C) (Oral)   Resp 16   Ht 5\' 3"  (1.6 m) Comment: per patient  Wt 156 lb 14.4 oz (71.2 kg)   LMP 10/17/2016   SpO2 98%   BMI 27.79 kg/m     Physical Exam Vitals reviewed.  Constitutional:      General: She is awake.     Appearance: Normal appearance. She is well-developed and well-groomed.  HENT:     Head: Normocephalic and atraumatic.  Pulmonary:     Effort: Pulmonary effort  is normal.  Chest:     Comments: Chaperone declined  Musculoskeletal:        General: Normal range of motion.  Skin:    General: Skin is warm and dry.     Findings: Rash present. Rash is macular.       Neurological:     General: No focal deficit present.     Mental Status: She is alert and oriented to person, place, and time.  Psychiatric:        Mood and Affect: Mood normal.        Behavior: Behavior normal. Behavior is cooperative.        Thought Content: Thought content normal.        Judgment: Judgment normal.       No results found for any visits on 06/11/23.  Assessment & Plan      Return in about 6 months (around 12/09/2023) for HLD, prediabetes, chronic follow up.     Problem List Items Addressed This Visit       Other   HLD (hyperlipidemia)    Chronic, ongoing Appears to be doing  well on current regimen  Recheck lipid panel today Results to dictate further management  Follow up in 6 months or sooner if concerns arise        Relevant Orders   Lipid Profile   Prediabetes    Recheck A1c today- Results to dictate further management        Relevant Orders   Hemoglobin A1C   CBC with Differential   CMP14+EGFR   COMPLETE METABOLIC PANEL WITH GFR   Other Visit Diagnoses     Rash    -  Primary Acute, ongoing Rash appears consistent with resolving yeast dermatitis Will send script for Nystatin cream to assist with resolution Reviewed keeping areas clean, dry and wearing loose fitting, natural fiber clothing Follow up as needed for persistent or progressing symptoms     Relevant Medications   nystatin cream (MYCOSTATIN)        Return in about 6 months (around 12/09/2023) for HLD, prediabetes, chronic follow up.   I, Arvie Bartholomew E Amonie Wisser, PA-C, have reviewed all documentation for this visit. The documentation on 06/11/23 for the exam, diagnosis, procedures, and orders are all accurate and complete.   Jacquelin Hawking, MHS, PA-C [facility]

## 2023-06-11 NOTE — Assessment & Plan Note (Signed)
Chronic, ongoing Appears to be doing well on current regimen  Recheck lipid panel today Results to dictate further management  Follow up in 6 months or sooner if concerns arise

## 2023-06-11 NOTE — Assessment & Plan Note (Signed)
Recheck A1c today Results to dictate further management

## 2023-06-12 LAB — COMPLETE METABOLIC PANEL WITH GFR
AG Ratio: 1.4 (calc) (ref 1.0–2.5)
ALT: 18 U/L (ref 6–29)
AST: 16 U/L (ref 10–35)
Albumin: 4.5 g/dL (ref 3.6–5.1)
Alkaline phosphatase (APISO): 104 U/L (ref 37–153)
BUN: 11 mg/dL (ref 7–25)
CO2: 29 mmol/L (ref 20–32)
Calcium: 10.5 mg/dL — ABNORMAL HIGH (ref 8.6–10.4)
Chloride: 102 mmol/L (ref 98–110)
Creat: 0.57 mg/dL (ref 0.50–1.03)
Globulin: 3.2 g/dL (ref 1.9–3.7)
Glucose, Bld: 87 mg/dL (ref 65–99)
Potassium: 4.5 mmol/L (ref 3.5–5.3)
Sodium: 141 mmol/L (ref 135–146)
Total Bilirubin: 0.4 mg/dL (ref 0.2–1.2)
Total Protein: 7.7 g/dL (ref 6.1–8.1)
eGFR: 105 mL/min/{1.73_m2} (ref >=60)

## 2023-06-12 LAB — CBC WITH DIFFERENTIAL/PLATELET
Absolute Monocytes: 637 {cells}/uL (ref 200–950)
Basophils Absolute: 48 {cells}/uL (ref 0–200)
Basophils Relative: 0.5 %
Eosinophils Absolute: 228 {cells}/uL (ref 15–500)
Eosinophils Relative: 2.4 %
HCT: 38.5 % (ref 35.0–45.0)
Hemoglobin: 12.4 g/dL (ref 11.7–15.5)
Lymphs Abs: 3268 {cells}/uL (ref 850–3900)
MCH: 26.7 pg — ABNORMAL LOW (ref 27.0–33.0)
MCHC: 32.2 g/dL (ref 32.0–36.0)
MCV: 82.8 fL (ref 80.0–100.0)
MPV: 10.9 fL (ref 7.5–12.5)
Monocytes Relative: 6.7 %
Neutro Abs: 5320 {cells}/uL (ref 1500–7800)
Neutrophils Relative %: 56 %
Platelets: 315 10*3/uL (ref 140–400)
RBC: 4.65 10*6/uL (ref 3.80–5.10)
RDW: 13 % (ref 11.0–15.0)
Total Lymphocyte: 34.4 %
WBC: 9.5 10*3/uL (ref 3.8–10.8)

## 2023-06-12 LAB — LIPID PANEL
Cholesterol: 168 mg/dL (ref <200)
HDL: 41 mg/dL — ABNORMAL LOW (ref >=50)
LDL Cholesterol (Calc): 90 mg/dL
Non-HDL Cholesterol (Calc): 127 mg/dL (ref <130)
Total CHOL/HDL Ratio: 4.1 (calc) (ref <5.0)
Triglycerides: 282 mg/dL — ABNORMAL HIGH (ref <150)

## 2023-06-12 LAB — HEMOGLOBIN A1C
Hgb A1c MFr Bld: 6.6 %{Hb} — ABNORMAL HIGH (ref <5.7)
Mean Plasma Glucose: 143 mg/dL
eAG (mmol/L): 7.9 mmol/L

## 2023-06-13 NOTE — Progress Notes (Signed)
Your labs are back Your A1c was 6.6 which is now in the diabetic range. Medications are not indicated at this time but we will need to keep an eye on this more frequently. I would recommend that you are seen every 3 months to get this checked. Please make sure that you are reducing your sugars and carbs to help prevent further progression Your cholesterol looks pretty good but your triglycerides were elevated. This can be due to eating within 8 hours of getting labs completed. We can recheck this at your next apt for monitoring Your CBC looks normal- no signs of anemia Your liver and kidney function were in normal ranges. Your electrolytes were normal with the exception of your Calcium which was mildly elevated. Please try to decreased your calcium intake in your diet which can assist with lowering this.  Please let us know if you have questions or concerns

## 2023-06-16 ENCOUNTER — Encounter: Payer: Self-pay | Admitting: Physician Assistant

## 2023-06-19 ENCOUNTER — Ambulatory Visit
Admission: RE | Admit: 2023-06-19 | Discharge: 2023-06-19 | Disposition: A | Discharge status: Home / Self Care | Payer: 59 | Source: Ambulatory Visit | Attending: Family Medicine | Admitting: Family Medicine

## 2023-06-19 DIAGNOSIS — Z1231 Encounter for screening mammogram for malignant neoplasm of breast: Secondary | ICD-10-CM

## 2023-06-20 ENCOUNTER — Encounter: Payer: Self-pay | Admitting: Physician Assistant

## 2023-07-23 ENCOUNTER — Encounter: Payer: Self-pay | Admitting: Physician Assistant

## 2023-07-24 ENCOUNTER — Telehealth (INDEPENDENT_AMBULATORY_CARE_PROVIDER_SITE_OTHER): Payer: 59 | Admitting: Internal Medicine

## 2023-07-24 DIAGNOSIS — J0100 Acute maxillary sinusitis, unspecified: Secondary | ICD-10-CM

## 2023-07-24 DIAGNOSIS — R051 Acute cough: Secondary | ICD-10-CM | POA: Diagnosis not present

## 2023-07-24 MED ORDER — BENZONATATE 100 MG PO CAPS: 100.0000 mg | ORAL_CAPSULE | Freq: Two times a day (BID) | ORAL | 0 refills | Status: DC | PRN

## 2023-07-24 MED ORDER — AMOXICILLIN-POT CLAVULANATE 875-125 MG PO TABS
1.0000 | ORAL_TABLET | Freq: Two times a day (BID) | ORAL | 0 refills | Status: AC
Start: 2023-07-24 — End: 2023-07-29

## 2023-07-24 NOTE — Progress Notes (Signed)
Virtual Visit via Video Note  I connected with Gail Harper on 07/24/23 at  8:00 AM EST by a video enabled telemedicine application and verified that I am speaking with the correct person using two identifiers.  Location: Patient: Work Provider: [facility]   I discussed the limitations of evaluation and management by telemedicine and the availability of in person appointments. The patient expressed understanding and agreed to proceed.  History of Present Illness:  Patient is presenting via telemedicine to discuss sinus problem. Symptoms started 2 weeks ago.   URI Compliant:  -Fever: no -Cough: yes, productive green mucus -Shortness of breath: no -Wheezing: no -Chest tightness: no -Chest congestion: no -Nasal congestion: yes -Runny nose: yes, with green mucus -Post nasal drip: yes -Sneezing: no -Sore throat: no -Sinus pressure: yes -Headache: yes -Face pain: no -Ear pain: no  -Ear pressure: no  -Context: fluctuating -Relief with OTC cold/cough medications: yes  -Treatments attempted: cold/sinus and cough syrup     Observations/Objective:  General: well appearing, no acute distress ENT: conjunctiva normal appearing bilaterally, congested sounding   Skin: no rashes, cyanosis or abnormal bruising noted Neuro: answers all questions appropriately   Assessment and Plan:  1. Acute non-recurrent maxillary sinusitis/Acute cough: Symptoms initially improving but then getting worse, going on for 2 weeks. Will treat with an antibiotic course, cough suppressant and over the counter decongestant. If patient's symptoms worsen or fail to improve over the course of this week she will need to be seen in the office.   - amoxicillin-clavulanate (AUGMENTIN) 875-125 MG tablet; Take 1 tablet by mouth 2 (two) times daily for 5 days.  Dispense: 10 tablet; Refill: 0 - benzonatate (TESSALON) 100 MG capsule; Take 1 capsule (100 mg total) by mouth 2 (two) times daily as needed for cough.  Dispense: 20  capsule; Refill: 0   Follow Up Instructions: PRN    I discussed the assessment and treatment plan with the patient. The patient was provided an opportunity to ask questions and all were answered. The patient agreed with the plan and demonstrated an understanding of the instructions.   The patient was advised to call back or seek an in-person evaluation if the symptoms worsen or if the condition fails to improve as anticipated.  I provided 9 minutes of non-face-to-face time during this encounter.   Margarita Mail, DO

## 2023-07-28 ENCOUNTER — Other Ambulatory Visit: Payer: Self-pay | Admitting: Family Medicine

## 2023-07-28 DIAGNOSIS — E782 Mixed hyperlipidemia: Secondary | ICD-10-CM

## 2023-07-31 ENCOUNTER — Other Ambulatory Visit: Payer: Self-pay | Admitting: Family Medicine

## 2023-07-31 DIAGNOSIS — E782 Mixed hyperlipidemia: Secondary | ICD-10-CM

## 2023-07-31 NOTE — Telephone Encounter (Signed)
Medication Refill -  Most Recent Primary Care Visit:  Provider: Margarita Mail  Department: [facility]  Visit Type: MYCHART VIDEO VISIT  Date: 07/24/2023  Medication: atorvastatin (LIPITOR) 40 MG tablet   Has the patient contacted their pharmacy? Yes (Agent: If no, request that the patient contact the pharmacy for the refill. If patient does not wish to contact the pharmacy document the reason why and proceed with request.) (Agent: If yes, when and what did the pharmacy advise?)  Is this the correct pharmacy for this prescription? Yes If no, delete pharmacy and type the correct one.  This is the patient's preferred pharmacy:  [phone] Dan [address], [place] - [address] [place] [address] Phone: [phone] Fax: [phone]   Has the prescription been filled recently? Yes  Is the patient out of the medication? No  Has the patient been seen for an appointment in the last year OR does the patient have an upcoming appointment? Yes  Can we respond through MyChart? Yes  Agent: Please be advised that Rx refills may take up to 3 business days. We ask that you follow-up with your pharmacy.

## 2023-08-01 MED ORDER — ATORVASTATIN CALCIUM 40 MG PO TABS: 40.0000 mg | ORAL_TABLET | Freq: Every day | ORAL | 0 refills | Status: DC

## 2023-08-01 NOTE — Telephone Encounter (Signed)
Requested by patient . Last labs 06/11/23.  Requested Prescriptions  Pending Prescriptions Disp Refills   atorvastatin (LIPITOR) 40 MG tablet 90 tablet 0    Sig: Take 1 tablet (40 mg total) by mouth daily.     Cardiovascular:  Antilipid - Statins Failed - 07/31/2023 10:12 AM      Failed - Lipid Panel in normal range within the last 12 months    Cholesterol, Total  Date Value Ref Range Status  11/19/2018 149 100 - 199 mg/dL Final   Cholesterol  Date Value Ref Range Status  06/11/2023 168 <200 mg/dL Final   LDL Cholesterol (Calc)  Date Value Ref Range Status  06/11/2023 90 mg/dL (calc) Final    Comment:    Reference range: <100 . Desirable range <100 mg/dL for primary prevention;   <70 mg/dL for patients with CHD or diabetic patients  with > or = 2 CHD risk factors. Marland Kitchen LDL-C is now calculated using the Martin-Hopkins  calculation, which is a validated novel method providing  better accuracy than the Friedewald equation in the  estimation of LDL-C.  Horald Pollen et al. Lenox Ahr. 4098;119(14): 2061-2068  (http://education.[facility].com/faq/FAQ164)    HDL  Date Value Ref Range Status  06/11/2023 41 (L) > OR = 50 mg/dL Final  78/29/5621 46 >30 mg/dL Final   Triglycerides  Date Value Ref Range Status  06/11/2023 282 (H) <150 mg/dL Final    Comment:    . If a non-fasting specimen was collected, consider repeat triglyceride testing on a fasting specimen if clinically indicated.  Perry Mount et al. J. of Clin. Lipidol. 2015;9:129-169. Marland Kitchen          Passed - Patient is not pregnant      Passed - Valid encounter within last 12 months    Recent Outpatient Visits           1 week ago Acute non-recurrent maxillary sinusitis   [facility] Margarita Mail, DO   1 month ago Rash   [facility] Mecum, Oswaldo Conroy, PA-C   7 months ago Right-sided thoracic back pain, unspecified chronicity   [facility]  Danelle Berry, PA-C   10 months ago Mixed hyperlipidemia   [facility] Mecum, Oswaldo Conroy, PA-C   1 year ago Acanthosis nigricans   [facility] Danelle Berry, [place]

## 2023-09-03 ENCOUNTER — Ambulatory Visit
Admission: RE | Admit: 2023-09-03 | Discharge: 2023-09-03 | Disposition: A | Discharge status: Home / Self Care | Payer: 59 | Source: Ambulatory Visit | Attending: Family Medicine | Admitting: Family Medicine

## 2023-09-03 VITALS — BP 152/78 | HR 88 | Temp 97.8°F | Resp 18

## 2023-09-03 DIAGNOSIS — K047 Periapical abscess without sinus: Secondary | ICD-10-CM | POA: Diagnosis not present

## 2023-09-03 MED ORDER — IBUPROFEN 600 MG PO TABS: 600.0000 mg | ORAL_TABLET | Freq: Four times a day (QID) | ORAL | 0 refills | Status: DC | PRN

## 2023-09-03 MED ORDER — AMOXICILLIN-POT CLAVULANATE 875-125 MG PO TABS: 1.0000 | ORAL_TABLET | Freq: Two times a day (BID) | ORAL | 0 refills | Status: DC

## 2023-09-03 NOTE — ED Triage Notes (Signed)
Pt has a bump on her gum line x 4 days.

## 2023-09-03 NOTE — ED Provider Notes (Signed)
[facility]    CSN: [id number] Arrival date & time: 09/03/23  1541      History   Chief Complaint Chief Complaint  Patient presents with   Abscess    HPI Iqlas Wanko is a 57 y.o. female.   HPI  Nasya presents for 3 to 4 days of swelling on the top gumline that is tender to touch.  She has to have some dental work in that area.  States that her dentist told her they have to pull all 4 teeth.  She called them but they were not open and then today they were closed for the holiday.  She is going on a cruise this weekend and wants to be sure that her symptoms are addressed before she leaves.  Denies pain with chewing. Deanie has otherwise been well and has no other concerns.    Past Medical History:  Diagnosis Date   Anemia    BP (high blood pressure) 09/09/2014   HLD (hyperlipidemia) 09/09/2014   Prediabetes 08/16/2022    Patient Active Problem List   Diagnosis Date Noted   Prediabetes 08/16/2022   Insomnia 04/25/2022   Iron deficiency anemia 03/17/2020   Family history of adult polycystic kidney disease 03/17/2020   Overweight (BMI 25.0-29.9) 03/17/2020   Menopause present, declines hormone replacement therapy 10/30/2017   Ovarian cyst, left 11/04/2015   Chronic neck pain 09/09/2014   HLD (hyperlipidemia) 09/09/2014    Past Surgical History:  Procedure Laterality Date   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 Authorizing Provider  amoxicillin-clavulanate (AUGMENTIN) 875-125 MG tablet Take 1 tablet by mouth every 12 (twelve) hours. 09/03/23  Yes Swayzee Wadley, DO  ibuprofen (ADVIL) 600 MG tablet Take 1 tablet (600 mg total) by mouth every 6 (six) hours as needed.  09/03/23  Yes Alesia Oshields, Seward Meth, DO  acetaminophen (TYLENOL) 325 MG tablet Take 650 mg by mouth every 6 (six) hours as needed for moderate pain.    [provider]  atorvastatin (LIPITOR) 40 MG tablet Take 1 tablet (40 mg total) by mouth daily. 08/01/23   Danelle Berry, PA-C  loratadine (CLARITIN) 10 MG tablet Take 1 tablet (10 mg total) by mouth daily. 04/01/21   Danelle Berry, PA-C  meloxicam (MOBIC) 15 MG tablet Take 1 tablet (15 mg total) by mouth daily as needed for pain. 12/21/22   Danelle Berry, PA-C  Multiple Vitamins-Minerals (ONE-A-DAY WOMENS PO) Take by mouth.    [provider]  nystatin cream (MYCOSTATIN) Apply 1 Application topically 2 (two) times daily. Apply to affected area every 4-6 hours x 10 days 06/11/23   Mecum, Erin E, PA-C  triamcinolone cream (KENALOG) 0.1 % Apply 1 Application topically 2 (two) times daily as needed. 06/06/23   Mecum, Oswaldo Conroy, PA-C    Family History Family History  Problem Relation Age of Onset   Heart disease Sister    Kidney disease  Sister    Polycystic kidney disease Sister    Stroke Mother    Kidney disease Mother    Polycystic kidney disease Mother    Cancer Father    Kidney disease Brother    Polycystic kidney disease Brother    Stroke Sister 18   Polycystic kidney disease Sister    Diabetes Neg Hx    Breast cancer Neg Hx     Social History Social History   Tobacco Use   Smoking status: Never   Smokeless tobacco: Never  Vaping Use   Vaping status: Never Used  Substance Use Topics   Alcohol use: No   Drug use: No     Allergies   Patient has no known allergies.   Review of Systems Review of Systems : negative unless otherwise stated in HPI.      Physical Exam Triage Vital Signs ED Triage Vitals [09/03/23 1608]  Encounter Vitals Group     BP (!) 152/78     Systolic BP Percentile      Diastolic BP Percentile      Pulse Rate 88     Resp 18     Temp 97.8 F (36.6 C)     Temp Source Oral     SpO2 97 %      Weight      Height      Head Circumference      Peak Flow      Pain Score 0     Pain Loc      Pain Education      Exclude from Growth Chart    No data found.  Updated Vital Signs BP (!) 152/78 (BP Location: Left Arm)   Pulse 88   Temp 97.8 F (36.6 C) (Oral)   Resp 18   LMP 10/17/2016   SpO2 97%   Visual Acuity Right Eye Distance:   Left Eye Distance:   Bilateral Distance:    Right Eye Near:   Left Eye Near:    Bilateral Near:     Physical Exam  GEN:     alert, well appearing female in no distress    HENT:  mucus membranes moist, oropharyngeal without lesions exudates or erythema, no nasal discharge, right upper incisor and gumline edema with tenderness to percussion, no trismus, no secretion pooling, no palpable induration and no visible swelling of the floor the mouth, normal jaw movement without difficulty EYES:   pupils equal and reactive, no scleral injection or discharge RESP:  no increased work of breathing CVS:   regular rate  Skin:   warm and dry, no rash or skin changes of on external jaw    UC Treatments / Results  Labs (all labs ordered are listed, but only abnormal results are displayed) Labs Reviewed - No data to display  EKG   Radiology No results found.  Procedures Procedures (including critical care time)  Medications Ordered in UC Medications - No data to display  Initial Impression / Assessment and Plan / UC Course  I have reviewed the triage vital signs and the nursing notes.  Pertinent labs & imaging results that were available during my care of the patient were reviewed by me and considered in my medical decision making (see chart for details).     Pt is a 57 y.o. female who presents for 4 days of dental pain. Geselle is afebrile here without recent antipyretics. Satting well on room air. Overall pt is well appearing, well hydrated, without respiratory  distress.  Dental exam concerning for developing dental abscess. Treat with  antibiotics as below.  - continue Tylenol with prescribed Motrin as needed for discomfort - Gargle with salt water several times a day -  Follow up with her dentist   Discussed MDM, treatment plan and plan for follow-up with patient who agrees with plan.   Final Clinical Impressions(s) / UC Diagnoses   Final diagnoses:  Dental abscess     Discharge Instructions      At this time there is no abscess to be drained. You were prescribed an antibiotic. Please take this exactly as directed and do not stop taking it until the entire course of medicine is finished, even if you begin to feel better before finishing the course.  Schedule an appointment with your dentist.            ED Prescriptions     Medication Sig Dispense Auth. Provider   ibuprofen (ADVIL) 600 MG tablet Take 1 tablet (600 mg total) by mouth every 6 (six) hours as needed. 30 tablet Dessiree Sze, DO   amoxicillin-clavulanate (AUGMENTIN) 875-125 MG tablet Take 1 tablet by mouth every 12 (twelve) hours. 20 tablet Katha Cabal, DO      PDMP not reviewed this encounter.   Katha Cabal, DO 09/03/23 1721

## 2023-09-03 NOTE — Discharge Instructions (Addendum)
At this time there is no abscess to be drained. You were prescribed an antibiotic. Please take this exactly as directed and do not stop taking it until the entire course of medicine is finished, even if you begin to feel better before finishing the course. Schedule an appointment with your dentist.

## 2023-09-25 DIAGNOSIS — H40013 Open angle with borderline findings, low risk, bilateral: Secondary | ICD-10-CM | POA: Diagnosis not present

## 2023-10-28 ENCOUNTER — Other Ambulatory Visit: Payer: Self-pay | Admitting: Family Medicine

## 2023-10-28 DIAGNOSIS — E782 Mixed hyperlipidemia: Secondary | ICD-10-CM

## 2023-10-31 ENCOUNTER — Ambulatory Visit: Payer: 59 | Admitting: Obstetrics and Gynecology

## 2023-11-05 ENCOUNTER — Other Ambulatory Visit: Payer: Self-pay | Admitting: Family Medicine

## 2023-11-05 DIAGNOSIS — E782 Mixed hyperlipidemia: Secondary | ICD-10-CM

## 2023-11-15 NOTE — Progress Notes (Signed)
 ANNUAL PREVENTATIVE CARE GYNECOLOGY  ENCOUNTER NOTE  Subjective:       Gail Harper is a 58 y.o. [phone] female here for a routine annual gynecologic exam. The patient is not sexually active. The patient is not taking hormone replacement therapy and she denies post-menopausal vaginal bleeding. The patient wears seatbelts: yes . The patient participates in regular exercise: yes. [facility] on the treadmill at least 20 min per day most days, more on the weekends). Using Apple Watch which has been motivating her to get up and move more during the day. Has the patient ever been transfused or tattooed?: no. The patient reports that there is not domestic violence in her life.    Current complaints: 1.  She has a spot on her left breast, has been there for several months. She was evaluated by her PCP and diagnosed with yeast infection. She was prescribed Nystatin cream, notes that it has not resolved the spot/rash, she has some itching in that area on and off. Initially thought it might be due to new bras that she had purchased.    Gynecologic History Patient's last menstrual period was 10/17/2016. Contraception: post menopausal status Last Pap: 08/16/2022. Results were: normal Last mammogram: 06/19/2023. Results were: normal Last Colonoscopy: 11/17/2014: 10 years Last Dexa Scan: Never Done   Obstetric History OB History  Gravida Para Term Preterm AB Living  2 2 2   2   SAB IAB Ectopic Multiple Live Births      2    # Outcome Date GA Lbr Len/2nd Weight Sex Type Anes PTL Lv  2 Term 1989   7 lb 1.8 oz (3.225 kg) F Vag-Spont   LIV  1 Term 1985   6 lb 6.4 oz (2.903 kg) F Vag-Spont   LIV    Past Medical History:  Diagnosis Date   Anemia    BP (high blood pressure) 09/09/2014   HLD (hyperlipidemia) 09/09/2014   Prediabetes 08/16/2022    Family History  Problem Relation Age of Onset   Heart disease Sister    Kidney disease Sister    Polycystic kidney disease Sister    Stroke Mother     Kidney disease Mother    Polycystic kidney disease Mother    Cancer Father    Kidney disease Brother    Polycystic kidney disease Brother    Stroke Sister 42   Polycystic kidney disease Sister    Diabetes Neg Hx    Breast cancer Neg Hx     Past Surgical History:  Procedure Laterality Date   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    Social History   Socioeconomic History   Marital status: Widowed    Spouse name: Not on file   Number of children: 2   Years of education: Not on file   Highest education level: 12th grade  Occupational History   Not on file  Tobacco Use   Smoking status: Never   Smokeless tobacco: Never  Vaping Use   Vaping status: Never Used  Substance and Sexual Activity   Alcohol use: No   Drug use: No   Sexual activity: Not Currently    Partners: Male  Other Topics Concern   Not on file  Social History Narrative   Not on file   Social Drivers of Health   Financial Resource Strain: Low Risk  (07/23/2023)   Overall Financial Resource Strain (CARDIA)    Difficulty of Paying Living Expenses: Not hard at all  Food Insecurity: No Food Insecurity (07/23/2023)   Hunger Vital Sign    Worried About Running Out of Food in the Last Year: Never true    Ran Out of Food in the Last Year: Never true  Transportation Needs: No Transportation Needs (07/23/2023)   PRAPARE - Administrator, Civil Service (Medical): No    Lack of Transportation (Non-Medical): No  Physical Activity: Sufficiently Active (07/23/2023)   Exercise Vital Sign    Days of Exercise per Week: 5 days    Minutes of Exercise per Session: 30 min  Stress: No Stress Concern Present (07/23/2023)   [employer] of Occupational Health - Occupational Stress Questionnaire    Feeling of Stress : Not at all  Social Connections: Moderately Integrated (07/23/2023)   Social Connection and Isolation Panel [NHANES]    Frequency of Communication with Friends and Family: More than three  times a week    Frequency of Social Gatherings with Friends and Family: More than three times a week    Attends Religious Services: More than 4 times per year    Active Member of [employer] or Organizations: Yes    Attends Banker Meetings: More than 4 times per year    Marital Status: Widowed  Intimate Partner Violence: Not At Risk (04/25/2022)   Humiliation, Afraid, Rape, and Kick questionnaire    Fear of Current or Ex-Partner: No    Emotionally Abused: No    Physically Abused: No    Sexually Abused: No    Current Outpatient Medications on File Prior to Visit  Medication Sig Dispense Refill   acetaminophen (TYLENOL) 325 MG tablet Take 650 mg by mouth every 6 (six) hours as needed for moderate pain.     amoxicillin-clavulanate (AUGMENTIN) 875-125 MG tablet Take 1 tablet by mouth every 12 (twelve) hours. 20 tablet 0   atorvastatin (LIPITOR) 40 MG tablet TAKE 1 TABLET BY MOUTH EVERY DAY 30 tablet 1   ibuprofen (ADVIL) 600 MG tablet Take 1 tablet (600 mg total) by mouth every 6 (six) hours as needed. 30 tablet 0   loratadine (CLARITIN) 10 MG tablet Take 1 tablet (10 mg total) by mouth daily. 90 tablet 3   meloxicam (MOBIC) 15 MG tablet Take 1 tablet (15 mg total) by mouth daily as needed for pain. 90 tablet 3   Multiple Vitamins-Minerals (ONE-A-DAY WOMENS PO) Take by mouth.     nystatin cream (MYCOSTATIN) Apply 1 Application topically 2 (two) times daily. Apply to affected area every 4-6 hours x 10 days 30 g 0   triamcinolone cream (KENALOG) 0.1 % Apply 1 Application topically 2 (two) times daily as needed. 30 g 1   No current facility-administered medications on file prior to visit.    No Known Allergies    Review of Systems ROS Review of Systems - General ROS: negative for - chills, fatigue, fever, hot flashes, night sweats, weight gain or weight loss Psychological ROS: negative for - anxiety, decreased libido, depression, mood swings, physical abuse or sexual  abuse Ophthalmic ROS: negative for - blurry vision, eye pain or loss of vision ENT ROS: negative for - headaches, hearing change, visual changes or vocal changes Allergy and Immunology ROS: negative for - hives, itchy/watery eyes or seasonal allergies Hematological and Lymphatic ROS: negative for - bleeding problems, bruising, swollen lymph nodes or weight loss Endocrine ROS: negative for - galactorrhea, hair pattern changes, hot flashes, malaise/lethargy, mood swings, palpitations, polydipsia/polyuria, skin changes, temperature intolerance or unexpected weight changes Breast  ROS: negative for - new or changing breast lumps or nipple discharge Respiratory ROS: negative for - cough or shortness of breath Cardiovascular ROS: negative for - chest pain, irregular heartbeat, palpitations or shortness of breath Gastrointestinal ROS: no abdominal pain, change in bowel habits, or black or bloody stools Genito-Urinary ROS: no dysuria, trouble voiding, or hematuria Musculoskeletal ROS: negative for - joint pain or joint stiffness Neurological ROS: negative for - bowel and bladder control changes Dermatological ROS: negative for rash. Positive for skin lesion on left breast.    Objective:   BP (!) 143/80   Pulse 72   Resp 16   Ht 5\' 3"  (1.6 m)   Wt 155 lb 8 oz (70.5 kg)   LMP 10/17/2016   BMI 27.55 kg/m  CONSTITUTIONAL: Well-developed, well-nourished female in no acute distress.  PSYCHIATRIC: Normal mood and affect. Normal behavior. Normal judgment and thought content. NEUROLGIC: Alert and oriented to person, place, and time. Normal muscle tone coordination. No cranial nerve deficit noted. HENT:  Normocephalic, atraumatic, External right and left ear normal. Oropharynx is clear and moist EYES: Conjunctivae and EOM are normal. Pupils are equal, round, and reactive to light. No scleral icterus.  NECK: Normal range of motion, supple, no masses.  Normal thyroid.  SKIN: Skin is warm and dry. No rash  noted. Not diaphoretic. No erythema. No pallor. CARDIOVASCULAR: Normal heart rate noted, regular rhythm, no murmur. RESPIRATORY: Clear to auscultation bilaterally. Effort and breath sounds normal, no problems with respiration noted. BREASTS: Symmetric in size. No masses, nipple drainage, or lymphadenopathy. A 0.5-1 cm slightly raised lesion at 8 o'clock ~ 3 cm from areola with new epithelial tissue with mild desquamation.  ABDOMEN: Soft, normal bowel sounds, no distention noted.  No tenderness, rebound or guarding.  BLADDER: Normal PELVIC:  Bladder no bladder distension noted  Urethra: normal appearing urethra with no masses, tenderness or lesions  Vulva: normal appearing vulva with no masses, tenderness or lesions  Vagina: normal appearing vagina with mild atrophy, no discharge, no lesions  Cervix: normal appearing cervix without discharge or lesions  Uterus: uterus is normal size, shape, consistency and nontender  Adnexa: normal adnexa in size, nontender and no masses  RV: External Exam NormaI, No Rectal Masses, and Normal Sphincter tone  MUSCULOSKELETAL: Normal range of motion. No tenderness.  No cyanosis, clubbing, or edema.  2+ distal pulses. LYMPHATIC: No Axillary, Supraclavicular, or Inguinal Adenopathy.   Labs: Lab Results  Component Value Date   WBC 9.5 06/11/2023   HGB 12.4 06/11/2023   HCT 38.5 06/11/2023   MCV 82.8 06/11/2023   PLT 315 06/11/2023    Lab Results  Component Value Date   CREATININE 0.57 06/11/2023   BUN 11 06/11/2023   NA 141 06/11/2023   K 4.5 06/11/2023   CL 102 06/11/2023   CO2 29 06/11/2023    Lab Results  Component Value Date   ALT 18 06/11/2023   AST 16 06/11/2023   BILITOT 0.4 06/11/2023    Lab Results  Component Value Date   CHOL 168 06/11/2023   HDL 41 (L) 06/11/2023   LDLCALC 90 06/11/2023   TRIG 282 (H) 06/11/2023   CHOLHDL 4.1 06/11/2023    No results found for: "TSH"  Lab Results  Component Value Date   HGBA1C 6.6 (H)  06/11/2023     Assessment:   1. Encounter for well woman exam with routine gynecological exam   2. Encounter for screening mammogram for malignant neoplasm of breast   3.  Hyperlipidemia, unspecified hyperlipidemia type   4. Type 2 diabetes mellitus without complication, without long-term current use of insulin (HCC)   5. Lesion of skin of breast      Plan:  - Pap:  UTD . Discussed option of 3 year of 5 year screens, patient will decide next visit.  - Mammogram: Ordered - Colon Screening:   UTD, Due in 1 year.  - Labs:  Done on 06/11/2023 - Routine preventative health maintenance measures emphasized:  Self Breast Exams and Exercise/Diet/Weight control - Flu vaccine status: 08/17/2023 - COVID Vaccination status:12/006/2024 - Patient appears to be recently diagnosed with diabetes, HgbA1c 6.6. Encouraged lifestyle modifications and dietary monitoring. To be managed by PCP.  - Mild dyslipidemia, on a statin.  - Advised that lesion on the breast has actually attempted to heal and scab with new epithelial cells. No longer needs Nystatin, can utilize cocoa butter or coconut oil to help area regain pigmentation.  - Return to Clinic - 1 Year   Hildred Laser, MD [facility]

## 2023-11-16 ENCOUNTER — Ambulatory Visit (INDEPENDENT_AMBULATORY_CARE_PROVIDER_SITE_OTHER): Payer: 59 | Admitting: Obstetrics and Gynecology

## 2023-11-16 ENCOUNTER — Encounter: Payer: Self-pay | Admitting: Obstetrics and Gynecology

## 2023-11-16 VITALS — BP 143/80 | HR 72 | Resp 16 | Ht 63.0 in | Wt 155.5 lb

## 2023-11-16 DIAGNOSIS — L988 Other specified disorders of the skin and subcutaneous tissue: Secondary | ICD-10-CM

## 2023-11-16 DIAGNOSIS — Z01419 Encounter for gynecological examination (general) (routine) without abnormal findings: Secondary | ICD-10-CM

## 2023-11-16 DIAGNOSIS — E119 Type 2 diabetes mellitus without complications: Secondary | ICD-10-CM

## 2023-11-16 DIAGNOSIS — E785 Hyperlipidemia, unspecified: Secondary | ICD-10-CM

## 2023-11-16 DIAGNOSIS — Z1231 Encounter for screening mammogram for malignant neoplasm of breast: Secondary | ICD-10-CM

## 2023-12-26 ENCOUNTER — Other Ambulatory Visit: Payer: Self-pay | Admitting: Family Medicine

## 2023-12-26 DIAGNOSIS — M238X1 Other internal derangements of right knee: Secondary | ICD-10-CM

## 2023-12-26 DIAGNOSIS — M546 Pain in thoracic spine: Secondary | ICD-10-CM

## 2023-12-27 NOTE — Telephone Encounter (Signed)
 Requested medications are due for refill today.  yes  Requested medications are on the active medications list.  yes  Last refill. 12/21/2022 #90 3 rf  Future visit scheduled.   no  Notes to clinic.  Pt last seen in office 12/21/2022.    Requested Prescriptions  Pending Prescriptions Disp Refills   meloxicam (MOBIC) 15 MG tablet [Pharmacy Med Name: MELOXICAM 15 MG TABLET] 30 tablet 11    Sig: TAKE 1 TABLET BY MOUTH EVERY DAY AS NEEDED FOR PAIN     Analgesics:  COX2 Inhibitors Failed - 12/27/2023  9:00 AM      Failed - Manual Review: Labs are only required if the patient has taken medication for more than 8 weeks.      Failed - Valid encounter within last 12 months    Recent Outpatient Visits   None            Passed - HGB in normal range and within 360 days    Hemoglobin  Date Value Ref Range Status  06/11/2023 12.4 11.7 - 15.5 g/dL Final         Passed - Cr in normal range and within 360 days    Creat  Date Value Ref Range Status  06/11/2023 0.57 0.50 - 1.03 mg/dL Final         Passed - HCT in normal range and within 360 days    HCT  Date Value Ref Range Status  06/11/2023 38.5 35.0 - 45.0 % Final         Passed - AST in normal range and within 360 days    AST  Date Value Ref Range Status  06/11/2023 16 10 - 35 U/L Final         Passed - ALT in normal range and within 360 days    ALT  Date Value Ref Range Status  06/11/2023 18 6 - 29 U/L Final         Passed - eGFR is 30 or above and within 360 days    GFR, Est African American  Date Value Ref Range Status  03/17/2020 118 > OR = 60 mL/min/1.56m2 Final   GFR, Est Non African American  Date Value Ref Range Status  03/17/2020 102 > OR = 60 mL/min/1.27m2 Final   eGFR  Date Value Ref Range Status  06/11/2023 105 > OR = 60 mL/min/1.21m2 Final         Passed - Patient is not pregnant

## 2024-01-01 ENCOUNTER — Other Ambulatory Visit: Payer: Self-pay | Admitting: Family Medicine

## 2024-01-01 DIAGNOSIS — E782 Mixed hyperlipidemia: Secondary | ICD-10-CM

## 2024-01-01 NOTE — Telephone Encounter (Signed)
 Requested Prescriptions  Pending Prescriptions Disp Refills   atorvastatin  (LIPITOR) 40 MG tablet [Pharmacy Med Name: ATORVASTATIN  40 MG TABLET] 90 tablet 0    Sig: Take 1 tablet (40 mg total) by mouth daily.     Cardiovascular:  Antilipid - Statins Failed - 01/01/2024  4:32 PM      Failed - Valid encounter within last 12 months    Recent Outpatient Visits   None            Failed - Lipid Panel in normal range within the last 12 months    Cholesterol, Total  Date Value Ref Range Status  11/19/2018 149 100 - 199 mg/dL Final   Cholesterol  Date Value Ref Range Status  06/11/2023 168 <200 mg/dL Final   LDL Cholesterol (Calc)  Date Value Ref Range Status  06/11/2023 90 mg/dL (calc) Final    Comment:    Reference range: <100 . Desirable range <100 mg/dL for primary prevention;   <70 mg/dL for patients with CHD or diabetic patients  with > or = 2 CHD risk factors. Aaron Aas LDL-C is now calculated using the Martin-Hopkins  calculation, which is a validated novel method providing  better accuracy than the Friedewald equation in the  estimation of LDL-C.  Melinda Sprawls et al. Erroll Heard. 1914;782(95): 2061-2068  (http://education.[employer].com/faq/FAQ164)    HDL  Date Value Ref Range Status  06/11/2023 41 (L) > OR = 50 mg/dL Final  62/13/0865 46 >78 mg/dL Final   Triglycerides  Date Value Ref Range Status  06/11/2023 282 (H) <150 mg/dL Final    Comment:    . If a non-fasting specimen was collected, consider repeat triglyceride testing on a fasting specimen if clinically indicated.  Imagene Mam et al. J. of Clin. Lipidol. 2015;9:129-169. Aaron Aas          Passed - Patient is not pregnant

## 2024-01-01 NOTE — Telephone Encounter (Signed)
Patient called, left VM to return the call to the office to schedule an OV for follow up.  ? ?

## 2024-01-11 ENCOUNTER — Ambulatory Visit: Admitting: Family Medicine

## 2024-01-18 ENCOUNTER — Ambulatory Visit: Admitting: Family Medicine

## 2024-01-18 VITALS — BP 122/84 | HR 82 | Resp 16 | Ht 63.0 in | Wt 155.0 lb

## 2024-01-18 DIAGNOSIS — E119 Type 2 diabetes mellitus without complications: Secondary | ICD-10-CM | POA: Insufficient documentation

## 2024-01-18 DIAGNOSIS — M546 Pain in thoracic spine: Secondary | ICD-10-CM | POA: Diagnosis not present

## 2024-01-18 DIAGNOSIS — E782 Mixed hyperlipidemia: Secondary | ICD-10-CM | POA: Diagnosis not present

## 2024-01-18 DIAGNOSIS — D509 Iron deficiency anemia, unspecified: Secondary | ICD-10-CM

## 2024-01-18 DIAGNOSIS — E1169 Type 2 diabetes mellitus with other specified complication: Secondary | ICD-10-CM

## 2024-01-18 MED ORDER — LANCETS MISC. MISC: 3 refills | Status: DC

## 2024-01-18 MED ORDER — ATORVASTATIN CALCIUM 40 MG PO TABS
40.0000 mg | ORAL_TABLET | Freq: Every day | ORAL | 1 refills | Status: DC
Start: 2024-01-18 — End: 2024-07-31

## 2024-01-18 MED ORDER — BLOOD GLUCOSE TEST VI STRP
ORAL_STRIP | 3 refills | Status: DC
Start: 2024-01-18 — End: 2024-05-13

## 2024-01-18 MED ORDER — MELOXICAM 15 MG PO TABS: 15.0000 mg | ORAL_TABLET | Freq: Every day | ORAL | 3 refills | Status: DC | PRN

## 2024-01-18 MED ORDER — BLOOD GLUCOSE MONITORING SUPPL DEVI: 0 refills | Status: AC

## 2024-01-18 NOTE — Patient Instructions (Addendum)
 Health Maintenance  Topic Date Due   Eye exam for diabetics  Never done   Yearly kidney health urinalysis for diabetes  Never done   Hemoglobin A1C  12/09/2023   Flu Shot  04/11/2024   Yearly kidney function blood test for diabetes  06/10/2024   Mammogram  06/18/2024   Colon Cancer Screening  11/16/2024   Complete foot exam   01/17/2025   Pap with HPV screening  08/17/2027   DTaP/Tdap/Td vaccine (2 - Td or Tdap) 12/22/2030   COVID-19 Vaccine  Completed   Hepatitis C Screening  Completed   HIV Screening  Completed   Zoster (Shingles) Vaccine  Completed   Pneumococcal Vaccination  Aged Out   HPV Vaccine  Aged Out   Meningitis B Vaccine  Aged Out   We did the diabetes urine test and foot test today You should call your eye doctor to get a diabetic eye exam done - once a year  See handout on blood sugar info (fasting blood sugar ranges etc) and standard of care  Blood Glucose Monitoring, Adult To manage your diabetes, you'll need to keep track of your blood sugar. This is called blood glucose monitoring. Check your blood glucose as often as told. Keep a journal of your results over time. This can help you: Know when to adjust your diabetes management plan with your health care provider. See how food, exercise, illness, and medicines affect your blood glucose. Know what your blood glucose is at any time. Your provider will set specific goals for your blood glucose levels. In many cases, these goals may be: Before meals: 80-130 mg/dL (4.4-7.2 mmol/L). After meals: below 180 mg/dL (10 mmol/L). A1C level: less than 7%. Supplies needed: Blood glucose meter. Test strips for your meter. Each brand of meter has its own strips. You must use the strips that came with your meter. A lancet. This is a sharp device used to poke your finger. Do not use a lancet more than once. A journal or logbook to write down your results. How to check your blood glucose Checking your blood glucose  Wash  your hands with soap and water for at least 20 seconds. Use the lancet to poke the side of your finger. Do not poke the tip of your finger. Also, try not to use the same finger each time. Gently squeeze the finger until a small drop of blood appears. Follow the meter instructions on how to insert the test strip, apply blood to the strip, and use the meter. Write down your result and any notes. Using different sites Some blood glucose meters allow testing on other parts of your body to test your blood. The most common places are the forearm, thigh, upper arm, and palm of the hand. Check your meter's instructions. Using different sites may not be as accurate as your fingers. If you think you have low blood glucose, only use your finger. General tips Blood glucose log  Write down the result each time you check your blood glucose. Note anything that may be affecting your blood glucose. This can help you and your provider: Look for patterns over time. Adjust your management plan as needed. Check if your meter has an app or lets you download your records to a computer. Most meters keep a record of glucose readings in the meter. If you have type 2 diabetes: You may need to check your blood glucose 2 or more times a day. Check your blood glucose as often as told  by your provider. This may include: Before and after exercise. Before doing things that have a risk of injury, such as driving or using machinery. You may need to check your blood glucose more often if: Your medicine is being adjusted. Your diabetes is not well controlled. You are ill. General tips Always have your blood glucose meter and supplies with you. After you use a few boxes of test strips, adjust your blood glucose meter as needed. Follow the meter instructions. If you have questions or need help, all blood glucose meters have a 24-hour hotline phone number that you can call. Also, contact your provider with any questions or  concerns. Where to find more information The American Diabetes Association: diabetes.org The Association of Diabetes Care & Education Specialists: diabeteseducator.org Contact a health care provider if: Your blood glucose is at or above 240 mg/dL (14.7 mmol/L) for 2 days in a row. You have been sick or have had a fever for 2 days or longer and are not getting better. You have any of these problems for more than 6 hours: You cannot eat or drink. You have nausea or vomiting. You have diarrhea. Get help right away if: Your blood glucose is lower than 54 mg/dL (3 mmol/L). You become confused, or you have trouble thinking clearly. You have trouble breathing. You have moderate to high ketone levels in your pee. These symptoms may be an emergency. Get help right away. Call 911. Do not wait to see if the symptoms will go away. Do not drive yourself to the hospital. This information is not intended to replace advice given to you by your health care provider. Make sure you discuss any questions you have with your health care provider. Document Revised: 04/10/2023 Document Reviewed: 07/14/2022 Elsevier Patient Education  2024 Elsevier Inc. Type 2 Diabetes Mellitus, Diagnosis, Adult Type 2 diabetes (type 2 diabetes mellitus) is a long-term (chronic) disease. It may happen when there is one or both of these problems: The pancreas does not make enough insulin. The body does not react in a normal way to insulin that it makes. Insulin lets sugars go into cells in your body. If you have type 2 diabetes, sugars cannot get into your cells. Sugars build up in the blood. This causes high blood sugar. What are the causes? The exact cause of this condition is not known. What increases the risk? Having type 2 diabetes in your family. Being overweight or very overweight. Not being active. Your body not reacting in a normal way to the insulin it makes. Having higher than normal blood sugar over  time. Having a type of diabetes when you were pregnant. Having a condition that causes small fluid-filled sacs on your ovaries. What are the signs or symptoms? At first, you may have no symptoms. You will get symptoms slowly. They may include: More thirst than normal. More hunger than normal. Needing to pee more than normal. Losing weight without trying. Feeling tired. Feeling weak. Seeing things blurry. Dark patches on your skin. How is this treated? This condition may be treated by a diabetes expert. You may need to: Follow an eating plan made by a food expert (dietitian). Get regular exercise. Find ways to deal with stress. Check blood sugar as often as told. Take medicines. Your doctor will set treatment goals for you. Your blood sugar should be at these levels: Before meals: 80-130 mg/dL (4.4-7.2 mmol/L). After meals: below 180 mg/dL (10 mmol/L). Over the last 2-3 months: less than 7%. Follow these  instructions at home: Medicines Take your diabetes medicines or insulin every day. Take medicines as told to help you prevent other problems caused by this condition. You may need: Aspirin. Medicine to lower cholesterol. Medicine to control blood pressure. Questions to ask your doctor Should I meet with a diabetes educator? What medicines do I need, and when should I take them? What will I need to treat my condition at home? When should I check my blood sugar? Where can I find a support group? Who can I call if I have questions? When is my next doctor visit? General instructions Take over-the-counter and prescription medicines only as told by your doctor. Keep all follow-up visits. Where to find more information For help and guidance and more information about diabetes, please go to: American Diabetes Association (ADA): www.diabetes.org American Association of Diabetes Care and Education Specialists (ADCES): www.diabeteseducator.org International Diabetes Federation  (IDF): DCOnly.dk Contact a doctor if: Your blood sugar is at or above 240 mg/dL (09.8 mmol/L) for 2 days in a row. You have been sick for 2 days or more, and you are not getting better. You have had a fever for 2 days or more, and you are not getting better. You have any of these problems for more than 6 hours: You cannot eat or drink. You feel like you may vomit. You vomit. You have watery poop (diarrhea). Get help right away if: Your blood sugar is lower than 54 mg/dL (3 mmol/L). You feel mixed up (confused). You have trouble thinking clearly. You have trouble breathing. You have medium or large ketone levels in your pee. These symptoms may be an emergency. Get help right away. Call your local emergency services (911 in the [place] Do not wait to see if the symptoms will go away. Do not drive yourself to the hospital. Summary Type 2 diabetes is a long-term disease. Your pancreas may not make enough insulin, or your body may not react in a normal way to insulin that it makes. This condition is treated with an eating plan, lifestyle changes, and medicines. Your doctor will set treatment goals for you. These will help you keep your blood sugar in a healthy range. Keep all follow-up visits. This information is not intended to replace advice given to you by your health care provider. Make sure you discuss any questions you have with your health care provider. Document Revised: 11/22/2020 Document Reviewed: 11/22/2020 Elsevier Patient Education  2024 [employer].

## 2024-01-18 NOTE — Assessment & Plan Note (Signed)
 Compliant with meds, no SE, no myalgias, fatigue or jaundice Reviewed recent lipids and refilled meds Lab Results  Component Value Date   CHOL 168 06/11/2023   HDL 41 (L) 06/11/2023   LDLCALC 90 06/11/2023   TRIG 282 (H) 06/11/2023   CHOLHDL 4.1 06/11/2023

## 2024-01-18 NOTE — Progress Notes (Signed)
 Name: Gail Harper   MRN: [id number]    DOB: 14-Jan-1966   Date:01/18/2024       Progress Note  Chief Complaint  Patient presents with   Medication Refill   Medical Management of Chronic Issues     Subjective:   Gail Harper is a 58 y.o. female, presents to clinic for routine follow up on chronic conditions  Last did labs with float provider last sept - was prediabetic, looks like new onset T2DM:  Lab Results  Component Value Date   HGBA1C 6.6 (H) 06/11/2023   HGBA1C 6.1 (H) 04/21/2022   Pt on statin: Lab Results  Component Value Date   CHOL 168 06/11/2023   HDL 41 (L) 06/11/2023   LDLCALC 90 06/11/2023   TRIG 282 (H) 06/11/2023   CHOLHDL 4.1 06/11/2023   Pt is not having any sx or complaints today with prior statin med     Current Outpatient Medications:    acetaminophen (TYLENOL) 325 MG tablet, Take 650 mg by mouth every 6 (six) hours as needed for moderate pain., Disp: , Rfl:    Blood Glucose Monitoring Suppl DEVI, Dispense one deviceglucometer per insurance coverage, Disp: 1 each, Rfl: 0   Glucose Blood (BLOOD GLUCOSE TEST STRIPS) STRP, Use as directed with glucometer up to TID prn for blood sugar monitoring.  May substitute to any manufacturer covered by patient's insurance., Disp: 100 strip, Rfl: 3   Lancets Misc. MISC, Use as directed with glucometer up to TID prn for blood sugar monitoring.  May substitute to any manufacturer covered by patient's insurance., Disp: 100 each, Rfl: 3   loratadine  (CLARITIN ) 10 MG tablet, Take 1 tablet (10 mg total) by mouth daily., Disp: 90 tablet, Rfl: 3   Multiple Vitamins-Minerals (ONE-A-DAY WOMENS PO), Take by mouth., Disp: , Rfl:    nystatin  cream (MYCOSTATIN ), Apply 1 Application topically 2 (two) times daily. Apply to affected area every 4-6 hours x 10 days, Disp: 30 g, Rfl: 0   triamcinolone  cream (KENALOG ) 0.1 %, Apply 1 Application topically 2 (two) times daily as needed., Disp: 30 g, Rfl: 1   atorvastatin  (LIPITOR)  40 MG tablet, Take 1 tablet (40 mg total) by mouth daily., Disp: 90 tablet, Rfl: 1   meloxicam  (MOBIC ) 15 MG tablet, Take 1 tablet (15 mg total) by mouth daily as needed for pain., Disp: 90 tablet, Rfl: 3  Patient Active Problem List   Diagnosis Date Noted   Type 2 diabetes mellitus without complication, without long-term current use of insulin (HCC) 01/18/2024   Insomnia 04/25/2022   Iron deficiency anemia 03/17/2020   Family history of adult polycystic kidney disease 03/17/2020   Overweight (BMI 25.0-29.9) 03/17/2020   Menopause present, declines hormone replacement therapy 10/30/2017   Ovarian cyst, left 11/04/2015   Chronic neck pain 09/09/2014   HLD (hyperlipidemia) 09/09/2014    Past Surgical History:  Procedure Laterality Date   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    Family History  Problem Relation Age of Onset   Heart disease Sister    Kidney disease Sister    Polycystic kidney disease Sister    Stroke Mother    Kidney disease Mother    Polycystic kidney disease Mother    Cancer Father    Kidney disease Brother    Polycystic kidney disease Brother    Stroke Sister 6   Polycystic kidney disease Sister    Diabetes Neg Hx    Breast cancer Neg Hx  Social History   Tobacco Use   Smoking status: Never   Smokeless tobacco: Never  Vaping Use   Vaping status: Never Used  Substance Use Topics   Alcohol use: No   Drug use: No     No Known Allergies  Health Maintenance  Topic Date Due   OPHTHALMOLOGY EXAM  Never done   Diabetic kidney evaluation - Urine ACR  Never done   HEMOGLOBIN A1C  12/09/2023   INFLUENZA VACCINE  04/11/2024   Diabetic kidney evaluation - eGFR measurement  06/10/2024   MAMMOGRAM  06/18/2024   Colonoscopy  11/16/2024   FOOT EXAM  01/17/2025   Cervical Cancer Screening (HPV/Pap Cotest)  08/17/2027   DTaP/Tdap/Td (2 - Td or Tdap) 12/22/2030   COVID-19 Vaccine  Completed   Hepatitis C Screening  Completed   HIV Screening  Completed    Zoster Vaccines- Shingrix   Completed   Pneumococcal Vaccine 9-9 Years old  Aged Out   HPV VACCINES  Aged Out   Meningococcal B Vaccine  Aged Out    Chart Review Today: I personally reviewed active problem list, medication list, allergies, family history, social history, health maintenance, notes from last encounter, lab results, imaging with the patient/caregiver today.   Review of Systems  Constitutional: Negative.   HENT: Negative.    Eyes: Negative.   Respiratory: Negative.    Cardiovascular: Negative.   Gastrointestinal: Negative.   Endocrine: Negative.   Genitourinary: Negative.   Musculoskeletal: Negative.   Skin: Negative.   Allergic/Immunologic: Negative.   Neurological: Negative.   Hematological: Negative.   Psychiatric/Behavioral: Negative.    All other systems reviewed and are negative.    Objective:   Vitals:   01/18/24 0810  BP: 122/84  Pulse: 82  Resp: 16  SpO2: 97%  Weight: 155 lb (70.3 kg)  Height: 5\' 3"  (1.6 m)    Body mass index is 27.46 kg/m.  Physical Exam Vitals and nursing note reviewed.  Constitutional:      General: She is not in acute distress.    Appearance: She is well-developed. She is not ill-appearing, toxic-appearing or diaphoretic.  HENT:     Head: Normocephalic and atraumatic.     Nose: Nose normal.  Eyes:     General:        Right eye: No discharge.        Left eye: No discharge.     Conjunctiva/sclera: Conjunctivae normal.  Neck:     Trachea: No tracheal deviation.  Cardiovascular:     Rate and Rhythm: Normal rate and regular rhythm.     Pulses: Normal pulses.     Heart sounds: Normal heart sounds.  Pulmonary:     Effort: Pulmonary effort is normal. No respiratory distress.     Breath sounds: Normal breath sounds. No stridor.  Musculoskeletal:     Right lower leg: No edema.     Left lower leg: No edema.  Skin:    General: Skin is warm and dry.     Findings: No rash.  Neurological:     Mental Status: She is  alert.     Motor: No abnormal muscle tone.     Coordination: Coordination normal.  Psychiatric:        Mood and Affect: Mood normal.        Behavior: Behavior normal.     Diabetic Foot Exam - Simple   Simple Foot Form Diabetic Foot exam was performed with the following findings: Yes 01/18/2024  8:56 AM  Visual Inspection No deformities, no ulcerations, no other skin breakdown bilaterally: Yes Sensation Testing Intact to touch and monofilament testing bilaterally: Yes Pulse Check Posterior Tibialis and Dorsalis pulse intact bilaterally: Yes Comments        Results for orders placed or performed in visit on 06/11/23  Hemoglobin A1C   Collection Time: 06/11/23  1:35 PM  Result Value Ref Range   Hgb A1c MFr Bld 6.6 (H) <5.7 % of total Hgb   Mean Plasma Glucose 143 mg/dL   eAG (mmol/L) 7.9 mmol/L  Lipid Profile   Collection Time: 06/11/23  1:35 PM  Result Value Ref Range   Cholesterol 168 <200 mg/dL   HDL 41 (L) > OR = 50 mg/dL   Triglycerides 308 (H) <150 mg/dL   LDL Cholesterol (Calc) 90 mg/dL (calc)   Total CHOL/HDL Ratio 4.1 <5.0 (calc)   Non-HDL Cholesterol (Calc) 127 <130 mg/dL (calc)  CBC with Differential   Collection Time: 06/11/23  1:35 PM  Result Value Ref Range   WBC 9.5 3.8 - 10.8 Thousand/uL   RBC 4.65 3.80 - 5.10 Million/uL   Hemoglobin 12.4 11.7 - 15.5 g/dL   HCT 65.7 84.6 - 96.2 %   MCV 82.8 80.0 - 100.0 fL   MCH 26.7 (L) 27.0 - 33.0 pg   MCHC 32.2 32.0 - 36.0 g/dL   RDW 95.2 84.1 - 32.4 %   Platelets 315 140 - 400 Thousand/uL   MPV 10.9 7.5 - 12.5 fL   Neutro Abs 5,320 1,500 - 7,800 cells/uL   Lymphs Abs 3,268 850 - 3,900 cells/uL   Absolute Monocytes 637 200 - 950 cells/uL   Eosinophils Absolute 228 15 - 500 cells/uL   Basophils Absolute 48 0 - 200 cells/uL   Neutrophils Relative % 56 %   Total Lymphocyte 34.4 %   Monocytes Relative 6.7 %   Eosinophils Relative 2.4 %   Basophils Relative 0.5 %  COMPLETE METABOLIC PANEL WITH GFR   Collection  Time: 06/11/23  1:35 PM  Result Value Ref Range   Glucose, Bld 87 65 - 99 mg/dL   BUN 11 7 - 25 mg/dL   Creat 4.01 0.27 - 2.53 mg/dL   eGFR 664 > OR = 60 QI/HKV/4.25Z5   BUN/Creatinine Ratio SEE NOTE: 6 - 22 (calc)   Sodium 141 135 - 146 mmol/L   Potassium 4.5 3.5 - 5.3 mmol/L   Chloride 102 98 - 110 mmol/L   CO2 29 20 - 32 mmol/L   Calcium  10.5 (H) 8.6 - 10.4 mg/dL   Total Protein 7.7 6.1 - 8.1 g/dL   Albumin 4.5 3.6 - 5.1 g/dL   Globulin 3.2 1.9 - 3.7 g/dL (calc)   AG Ratio 1.4 1.0 - 2.5 (calc)   Total Bilirubin 0.4 0.2 - 1.2 mg/dL   Alkaline phosphatase (APISO) 104 37 - 153 U/L   AST 16 10 - 35 U/L   ALT 18 6 - 29 U/L      Assessment & Plan:   Type 2 diabetes mellitus without complication, without long-term current use of insulin (HCC) Assessment & Plan: New onset as of 05/2023 with labs with float provider, Dx added to chart, discussed A1c, dx, standard of care with pt today DM foot exam done today Labs ordered to recheck, she is not on meds or doing any specific diet changes since last labs Ordered glucometer and educated pt about T2DM, handouts/info printed for pt today as well Discussed fasting blood sugar range if she chooses to  monitor sugars explained 70-<100 normal, goal for T2DM 70 to less than 130's If A1c is not at goal we can do f/up to discuss med options, for now recheck labs and work on diet efforts - discussed reducing simple sugars and carbs, reduce portion of carbs in meals  Orders: -     Hemoglobin A1c -     Comprehensive metabolic panel with GFR -     Microalbumin / creatinine urine ratio -     Blood Glucose Monitoring Suppl; Dispense one deviceglucometer per insurance coverage  Dispense: 1 each; Refill: 0 -     Blood Glucose Test; Use as directed with glucometer up to TID prn for blood sugar monitoring.  May substitute to any manufacturer covered by patient's insurance.  Dispense: 100 strip; Refill: 3 -     Lancets Misc.; Use as directed with glucometer  up to TID prn for blood sugar monitoring.  May substitute to any manufacturer covered by patient's insurance.  Dispense: 100 each; Refill: 3  Mixed hyperlipidemia Assessment & Plan: Compliant with meds, no SE, no myalgias, fatigue or jaundice Reviewed recent lipids and refilled meds Lab Results  Component Value Date   CHOL 168 06/11/2023   HDL 41 (L) 06/11/2023   LDLCALC 90 06/11/2023   TRIG 282 (H) 06/11/2023   CHOLHDL 4.1 06/11/2023      Orders: -     Comprehensive metabolic panel with GFR -     Atorvastatin  Calcium ; Take 1 tablet (40 mg total) by mouth daily.  Dispense: 90 tablet; Refill: 1  Iron deficiency anemia, unspecified iron deficiency anemia type Assessment & Plan: She was on iron supplement and recently changed to multivitamin. No recent iron panels, she wishes to recheck CBC and iron   Orders: -     CBC with Differential/Platelet -     Iron, TIBC and Ferritin Panel  Right-sided thoracic back pain, unspecified chronicity -     Meloxicam ; Take 1 tablet (15 mg total) by mouth daily as needed for pain.  Dispense: 90 tablet; Refill: 3  Refilled mobic  - explained not to be take with same day NSAIDS but can with tylenol.    Return in about 6 months (around 07/20/2024) for Routine follow-up.   Adeline Hone, PA-C 01/18/24 11:31 AM

## 2024-01-18 NOTE — Assessment & Plan Note (Signed)
 She was on iron supplement and recently changed to multivitamin. No recent iron panels, she wishes to recheck CBC and iron

## 2024-01-18 NOTE — Assessment & Plan Note (Signed)
 New onset as of 05/2023 with labs with float provider, Dx added to chart, discussed A1c, dx, standard of care with pt today DM foot exam done today Labs ordered to recheck, she is not on meds or doing any specific diet changes since last labs Ordered glucometer and educated pt about T2DM, handouts/info printed for pt today as well Discussed fasting blood sugar range if she chooses to monitor sugars explained 70-<100 normal, goal for T2DM 70 to less than 130's If A1c is not at goal we can do f/up to discuss med options, for now recheck labs and work on diet efforts - discussed reducing simple sugars and carbs, reduce portion of carbs in meals

## 2024-01-19 LAB — MICROALBUMIN / CREATININE URINE RATIO
Creatinine, Urine: 115 mg/dL (ref 20–275)
Microalb Creat Ratio: 6 mg/g{creat} (ref <30)
Microalb, Ur: 0.7 mg/dL

## 2024-01-19 LAB — COMPREHENSIVE METABOLIC PANEL WITH GFR
AG Ratio: 1.4 (calc) (ref 1.0–2.5)
ALT: 21 U/L (ref 6–29)
AST: 16 U/L (ref 10–35)
Albumin: 4.3 g/dL (ref 3.6–5.1)
Alkaline phosphatase (APISO): 89 U/L (ref 37–153)
BUN: 17 mg/dL (ref 7–25)
CO2: 29 mmol/L (ref 20–32)
Calcium: 9.8 mg/dL (ref 8.6–10.4)
Chloride: 104 mmol/L (ref 98–110)
Creat: 0.61 mg/dL (ref 0.50–1.03)
Globulin: 3 g/dL (ref 1.9–3.7)
Glucose, Bld: 109 mg/dL — ABNORMAL HIGH (ref 65–99)
Potassium: 4.3 mmol/L (ref 3.5–5.3)
Sodium: 141 mmol/L (ref 135–146)
Total Bilirubin: 0.4 mg/dL (ref 0.2–1.2)
Total Protein: 7.3 g/dL (ref 6.1–8.1)
eGFR: 103 mL/min/{1.73_m2} (ref >=60)

## 2024-01-19 LAB — HEMOGLOBIN A1C
Hgb A1c MFr Bld: 6.7 % — ABNORMAL HIGH (ref <5.7)
Mean Plasma Glucose: 146 mg/dL
eAG (mmol/L): 8.1 mmol/L

## 2024-01-19 LAB — CBC WITH DIFFERENTIAL/PLATELET
Absolute Lymphocytes: 2999 {cells}/uL (ref 850–3900)
Absolute Monocytes: 721 {cells}/uL (ref 200–950)
Basophils Absolute: 53 {cells}/uL (ref 0–200)
Basophils Relative: 0.6 %
Eosinophils Absolute: 134 {cells}/uL (ref 15–500)
Eosinophils Relative: 1.5 %
HCT: 34.7 % — ABNORMAL LOW (ref 35.0–45.0)
Hemoglobin: 11.1 g/dL — ABNORMAL LOW (ref 11.7–15.5)
MCH: 26.2 pg — ABNORMAL LOW (ref 27.0–33.0)
MCHC: 32 g/dL (ref 32.0–36.0)
MCV: 81.8 fL (ref 80.0–100.0)
MPV: 10.8 fL (ref 7.5–12.5)
Monocytes Relative: 8.1 %
Neutro Abs: 4993 {cells}/uL (ref 1500–7800)
Neutrophils Relative %: 56.1 %
Platelets: 289 10*3/uL (ref 140–400)
RBC: 4.24 10*6/uL (ref 3.80–5.10)
RDW: 13.3 % (ref 11.0–15.0)
Total Lymphocyte: 33.7 %
WBC: 8.9 10*3/uL (ref 3.8–10.8)

## 2024-01-19 LAB — IRON,TIBC AND FERRITIN PANEL
%SAT: 20 % (ref 16–45)
Ferritin: 237 ng/mL — ABNORMAL HIGH (ref 16–232)
Iron: 57 ug/dL (ref 45–160)
TIBC: 281 ug/dL (ref 250–450)

## 2024-01-21 ENCOUNTER — Encounter: Payer: Self-pay | Admitting: Family Medicine

## 2024-01-25 ENCOUNTER — Encounter: Payer: Self-pay | Admitting: Family Medicine

## 2024-02-01 ENCOUNTER — Other Ambulatory Visit (INDEPENDENT_AMBULATORY_CARE_PROVIDER_SITE_OTHER)

## 2024-02-01 DIAGNOSIS — D509 Iron deficiency anemia, unspecified: Secondary | ICD-10-CM

## 2024-02-01 LAB — POC HEMOCCULT BLD/STL (HOME/3-CARD/SCREEN)
Card #2 Fecal Occult Blod, POC: NEGATIVE
Card #3 Fecal Occult Blood, POC: NEGATIVE
Fecal Occult Blood, POC: NEGATIVE

## 2024-02-01 NOTE — Addendum Note (Signed)
 Addended by: Elissa Guise on: 02/01/2024 02:20 PM   Modules accepted: Orders

## 2024-02-01 NOTE — Progress Notes (Signed)
 Patient dropped off stool card. 01/30/24-02/01/24: All negative

## 2024-05-08 ENCOUNTER — Other Ambulatory Visit: Payer: Self-pay | Admitting: Family Medicine

## 2024-05-08 DIAGNOSIS — Z1231 Encounter for screening mammogram for malignant neoplasm of breast: Secondary | ICD-10-CM

## 2024-05-11 ENCOUNTER — Other Ambulatory Visit: Payer: Self-pay | Admitting: Family Medicine

## 2024-05-11 DIAGNOSIS — E119 Type 2 diabetes mellitus without complications: Secondary | ICD-10-CM

## 2024-05-13 NOTE — Telephone Encounter (Signed)
 Requested medication (s) are due for refill today: routing for review  Requested medication (s) are on the active medication list: yes  Last refill:  01/18/24  Future visit scheduled:   Notes to clinic:  unable to attach protocol, routing for review     Requested Prescriptions  Pending Prescriptions Disp Refills   ACCU-CHEK GUIDE TEST test strip [Pharmacy Med Name: ACCU-CHEK GUIDE TEST STRIP] 100 strip 3    Sig: USE AS DIRECTED WITH GLUCOMETER UP TO 3 TIMES DAILY AS NEEDED FOR BLOOD SUGAR MONITORING.     There is no refill protocol information for this order

## 2024-06-18 ENCOUNTER — Encounter: Payer: Self-pay | Admitting: Family Medicine

## 2024-06-19 ENCOUNTER — Ambulatory Visit
Admission: RE | Admit: 2024-06-19 | Discharge: 2024-06-19 | Disposition: A | Discharge status: Home / Self Care | Source: Ambulatory Visit | Attending: Family Medicine | Admitting: Family Medicine

## 2024-06-19 DIAGNOSIS — Z1231 Encounter for screening mammogram for malignant neoplasm of breast: Secondary | ICD-10-CM | POA: Insufficient documentation

## 2024-06-23 ENCOUNTER — Encounter: Payer: Self-pay | Admitting: Nurse Practitioner

## 2024-06-23 ENCOUNTER — Ambulatory Visit: Admitting: Nurse Practitioner

## 2024-06-23 VITALS — BP 124/74 | HR 88 | Ht 63.0 in | Wt 150.0 lb

## 2024-06-23 DIAGNOSIS — B353 Tinea pedis: Secondary | ICD-10-CM

## 2024-06-23 DIAGNOSIS — L600 Ingrowing nail: Secondary | ICD-10-CM

## 2024-06-23 MED ORDER — TERBINAFINE HCL 1 % EX CREA: TOPICAL_CREAM | CUTANEOUS | 3 refills | Status: AC

## 2024-06-23 NOTE — Progress Notes (Signed)
 BP 124/74   Pulse 88   Ht 5' 3 (1.6 m)   Wt 150 lb (68 kg)   LMP 10/17/2016   BMI 26.57 kg/m    Subjective:    Patient ID: Gail Harper, female    DOB: August 09, 1966, 58 y.o.   MRN: [id number]  HPI: Gail Harper is a 58 y.o. female  Chief Complaint  Patient presents with   Toe Pain    Big toe, L foot. X2 months. Painful to touch sometimes. Foot has been really dry   Discussed the use of AI scribe software for clinical note transcription with the patient, who gave verbal consent to proceed.  History of Present Illness Gail Harper is a 58 year old female who presents with left great toe pain for two months.  Left great toe pain - Intermittent pain in the left great toe for two months - Pain localized to the side of the toe - Pain intensity varies and is sometimes worsened by pressure from shoes - Toenail appears possibly ingrown - No prior history of ingrown toenails - Unusual pain around the big toe during last pedicure in August - Pedicures are infrequent, occurring only once or twice a year  Foot xerosis and pruritus - Dryness of both feet, more pronounced on the left - Dryness present for a longer duration than the toe pain - Intermittent pruritus of the dry skin on the feet - Application of Vaseline to affected areas  Nail abnormalities of the toes - Nail changes affecting both little toes, described as 'bungled' - Application of a nail solution to the affected nails         07/24/2023    7:48 AM 06/11/2023   12:58 PM 12/21/2022    9:11 AM  Depression screen PHQ 2/9  Decreased Interest 0 0 0  Down, Depressed, Hopeless 0 0 0  PHQ - 2 Score 0 0 0  Altered sleeping 0 2 0  Tired, decreased energy 0 1 0  Change in appetite 0 0 0  Feeling bad or failure about yourself  0 0 0  Trouble concentrating 0 0 0  Moving slowly or fidgety/restless 0 0 0  Suicidal thoughts 0 0 0  PHQ-9 Score 0 3 0  Difficult doing work/chores Not difficult at all Not  difficult at all Not difficult at all    Relevant past medical, surgical, family and social history reviewed and updated as indicated. Interim medical history since our last visit reviewed. Allergies and medications reviewed and updated.  Review of Systems  Ten systems reviewed and is negative except as mentioned in HPI      Objective:      BP 124/74   Pulse 88   Ht 5' 3 (1.6 m)   Wt 150 lb (68 kg)   LMP 10/17/2016   BMI 26.57 kg/m    Wt Readings from Last 3 Encounters:  06/23/24 150 lb (68 kg)  01/18/24 155 lb (70.3 kg)  11/16/23 155 lb 8 oz (70.5 kg)    Physical Exam GENERAL: Alert, cooperative, well developed, no acute distress HEENT: Normocephalic, normal oropharynx, moist mucous membranes CHEST: Clear to auscultation bilaterally, No wheezes, rhonchi, or crackles CARDIOVASCULAR: Normal heart rate and rhythm, S1 and S2 normal without murmurs ABDOMEN: Soft, non-tender, non-distended, without organomegaly, Normal bowel sounds EXTREMITIES: No cyanosis or edema NEUROLOGICAL: Cranial nerves grossly intact, Moves all extremities without gross motor or sensory deficit  Results for orders placed or performed in visit on  02/01/24  POC Hemoccult Bld/Stl (3-Cd Home Screen)   Collection Time: 02/01/24  2:18 PM  Result Value Ref Range   Card #1 Date 01/30/2024    Fecal Occult Blood, POC Negative Negative   Card #2 Date 01/31/2024    Card #2 Fecal Occult Blod, POC Negative    Card #3 Date 02/01/2024    Card #3 Fecal Occult Blood, POC Negative           Assessment & Plan:   Problem List Items Addressed This Visit   None Visit Diagnoses       Ingrown nail of great toe    -  Primary   Relevant Orders   Ambulatory referral to Podiatry     Tinea pedis of left foot       Relevant Medications   terbinafine  (LAMISIL ) 1 % cream        Assessment and Plan Assessment & Plan Ingrown toenail, left great toe Chronic soreness on the side of the left great toe, likely due  to an ingrown toenail. Pain is intermittent and exacerbated by pressure from shoes. No prior history of ingrown toenails. Recent pedicure may have contributed to the condition. - Refer to podiatry for further evaluation and management. - Advise Epsom salt baths to alleviate discomfort.  Tinea pedis, left foot Chronic dry skin on both feet, more pronounced on the left. Occasional itching. Appearance suggests a fungal infection. She has been using Vaseline without significant improvement. - Prescribe Lamisil  cream for application to affected areas twice daily.        Follow up plan: Return if symptoms worsen or fail to improve.

## 2024-06-27 ENCOUNTER — Telehealth: Payer: Self-pay

## 2024-06-27 NOTE — Telephone Encounter (Signed)
 Copied from CRM #[id number]. Topic: Higher education careers adviser Patient (Clinic Use ONLY) >> Jun 26, 2024  9:26 AM Eleanor HERO wrote: Reason for CRM: lvm for pt to call back to let us  know if she has had her diabetic eye exam and if so , when and where >> Jun 27, 2024  9:11 AM Mia F wrote: Pt states her next diabetic exam is in January. Pt wants to know if pcp would like the last results she received or if she wants to wait until her January exam. Pt says there weren't any issues at the last exam

## 2024-07-01 ENCOUNTER — Ambulatory Visit: Admitting: Podiatry

## 2024-07-01 DIAGNOSIS — B353 Tinea pedis: Secondary | ICD-10-CM | POA: Diagnosis not present

## 2024-07-01 DIAGNOSIS — L600 Ingrowing nail: Secondary | ICD-10-CM

## 2024-07-01 MED ORDER — CLOTRIMAZOLE-BETAMETHASONE 1-0.05 % EX CREA: 1.0000 | TOPICAL_CREAM | Freq: Every day | CUTANEOUS | 0 refills | Status: AC

## 2024-07-01 NOTE — Addendum Note (Signed)
 Addended by: Elvie Palomo on: 07/01/2024 10:45 AM   Modules accepted: Orders, Level of Service

## 2024-07-01 NOTE — Progress Notes (Addendum)
 Subjective:  Patient ID: Gail Harper, female    DOB: Aug 23, 1966,  MRN: [id number]  Chief Complaint  Patient presents with   Ingrown Toenail    58 y.o. female presents with the above complaint.  Patient presents with left medial border ingrown painful to touch is progressing and worse worse with ambulation or shoe pressure she would like to have it removed has not seen MRIs prior to seeing me denies any other acute complaints pain scale 7 out of 10 dull aching nature.  She also has secondary complaint of athlete's foot to bilateral feet.  She would like to discuss treatment options she tried over-the-counter option which has not helped   Review of Systems: Negative except as noted in the HPI. Denies N/V/F/Ch.  Past Medical History:  Diagnosis Date   Anemia    BP (high blood pressure) 09/09/2014   GERD (gastroesophageal reflux disease) occasionally   Heart murmur years ago   HLD (hyperlipidemia) 09/09/2014   Prediabetes 08/16/2022   Thyroid disease not disease, but was monitoring    Current Outpatient Medications:    ACCU-CHEK GUIDE TEST test strip, USE AS DIRECTED WITH GLUCOMETER UP TO 3 TIMES DAILY AS NEEDED FOR BLOOD SUGAR MONITORING., Disp: 100 strip, Rfl: 3   acetaminophen (TYLENOL) 325 MG tablet, Take 650 mg by mouth every 6 (six) hours as needed for moderate pain., Disp: , Rfl:    atorvastatin  (LIPITOR) 40 MG tablet, Take 1 tablet (40 mg total) by mouth daily., Disp: 90 tablet, Rfl: 1   Blood Glucose Monitoring Suppl DEVI, Dispense one deviceglucometer per insurance coverage, Disp: 1 each, Rfl: 0   Lancets Misc. MISC, Use as directed with glucometer up to TID prn for blood sugar monitoring.  May substitute to any manufacturer covered by patient's insurance., Disp: 100 each, Rfl: 3   loratadine  (CLARITIN ) 10 MG tablet, Take 1 tablet (10 mg total) by mouth daily., Disp: 90 tablet, Rfl: 3   meloxicam  (MOBIC ) 15 MG tablet, Take 1 tablet (15 mg total) by mouth daily as needed for  pain., Disp: 90 tablet, Rfl: 3   Multiple Vitamins-Minerals (ONE-A-DAY WOMENS PO), Take by mouth., Disp: , Rfl:    nystatin  cream (MYCOSTATIN ), Apply 1 Application topically 2 (two) times daily. Apply to affected area every 4-6 hours x 10 days, Disp: 30 g, Rfl: 0   terbinafine  (LAMISIL ) 1 % cream, Apply to affected area BID, Disp: 42 g, Rfl: 3   triamcinolone  cream (KENALOG ) 0.1 %, Apply 1 Application topically 2 (two) times daily as needed., Disp: 30 g, Rfl: 1  Social History   Tobacco Use  Smoking Status Never  Smokeless Tobacco Never    No Known Allergies Objective:  There were no vitals filed for this visit. There is no height or weight on file to calculate BMI. Constitutional Well developed. Well nourished.  Vascular Dorsalis pedis pulses palpable bilaterally. Posterior tibial pulses palpable bilaterally. Capillary refill normal to all digits.  No cyanosis or clubbing noted. Pedal hair growth normal.  Neurologic Normal speech. Oriented to person, place, and time. Epicritic sensation to light touch grossly present bilaterally.  Dermatologic Painful ingrowing nail at medial nail borders of the hallux nail left.  Bilateral athlete's foot noted to both feet.  No open wounds or lesion noted subjective complaint of itching noted No other open wounds. No skin lesions.  Orthopedic: Normal joint ROM without pain or crepitus bilaterally. No visible deformities. No bony tenderness.   Radiographs: None Assessment:   1. Ingrown left big  toenail    Plan:  Patient was evaluated and treated and all questions answered.  Bilateral athlete's foot - Given the amount of athlete's foot that is present she would benefit from Lotrisone cream I encouraged her to apply twice a day she states understanding the cream was sent to the pharmacy  Ingrown Nail, left -Patient elects to proceed with minor surgery to remove ingrown toenail removal today. Consent reviewed and signed by  patient. -Ingrown nail excised. See procedure note. -Educated on post-procedure care including soaking. Written instructions provided and reviewed. -Patient to follow up in 2 weeks for nail check.  Procedure: Excision of Ingrown Toenail Location: Left 1st toe medial nail borders. Anesthesia: Lidocaine 1% plain; 1.5 mL and Marcaine 0.5% plain; 1.5 mL, digital block. Skin Prep: Betadine. Dressing: Silvadene; telfa; dry, sterile, compression dressing. Technique: Following skin prep, the toe was exsanguinated and a tourniquet was secured at the base of the toe. The affected nail border was freed, split with a nail splitter, and excised. Chemical matrixectomy was then performed with phenol and irrigated out with alcohol. The tourniquet was then removed and sterile dressing applied. Disposition: Patient tolerated procedure well. Patient to return in 2 weeks for follow-up.   No follow-ups on file.

## 2024-07-18 ENCOUNTER — Telehealth: Payer: Self-pay

## 2024-07-18 NOTE — Telephone Encounter (Signed)
 Pt overdue for a follow up

## 2024-07-18 NOTE — Telephone Encounter (Signed)
 Copied from CRM #[id number]. Topic: General - Other >> Jul 18, 2024  4:37 PM Travis F wrote: Reason for CRM: Patient is calling in because she wants to know if Gail Harper will be back in January. She also wanted to know if Mliss could look over her chart and see if she needs to be seen or if it can wait until January.

## 2024-07-21 ENCOUNTER — Ambulatory Visit: Admitting: Family Medicine

## 2024-07-30 ENCOUNTER — Encounter: Payer: Self-pay | Admitting: Podiatry

## 2024-07-31 ENCOUNTER — Encounter: Payer: Self-pay | Admitting: Nurse Practitioner

## 2024-07-31 ENCOUNTER — Ambulatory Visit: Admitting: Nurse Practitioner

## 2024-07-31 VITALS — BP 136/88 | HR 67 | Temp 98.0°F | Ht 63.0 in | Wt 152.0 lb

## 2024-07-31 DIAGNOSIS — E782 Mixed hyperlipidemia: Secondary | ICD-10-CM | POA: Diagnosis not present

## 2024-07-31 DIAGNOSIS — Z130 Encounter for screening for diseases of the blood and blood-forming organs and certain disorders involving the immune mechanism: Secondary | ICD-10-CM

## 2024-07-31 DIAGNOSIS — M542 Cervicalgia: Secondary | ICD-10-CM | POA: Diagnosis not present

## 2024-07-31 DIAGNOSIS — M546 Pain in thoracic spine: Secondary | ICD-10-CM | POA: Diagnosis not present

## 2024-07-31 DIAGNOSIS — G8929 Other chronic pain: Secondary | ICD-10-CM

## 2024-07-31 DIAGNOSIS — E119 Type 2 diabetes mellitus without complications: Secondary | ICD-10-CM | POA: Diagnosis not present

## 2024-07-31 DIAGNOSIS — J309 Allergic rhinitis, unspecified: Secondary | ICD-10-CM | POA: Diagnosis not present

## 2024-07-31 MED ORDER — LANCETS MISC. MISC: 3 refills | Status: AC

## 2024-07-31 MED ORDER — ATORVASTATIN CALCIUM 40 MG PO TABS: 40.0000 mg | ORAL_TABLET | Freq: Every day | ORAL | 1 refills | Status: AC

## 2024-07-31 MED ORDER — MELOXICAM 15 MG PO TABS: 15.0000 mg | ORAL_TABLET | Freq: Every day | ORAL | 3 refills | Status: AC | PRN

## 2024-07-31 NOTE — Progress Notes (Signed)
 BP 136/88   Pulse 67   Temp 98 F (36.7 C)   Ht 5' 3 (1.6 m)   Wt 152 lb (68.9 kg)   LMP 10/17/2016   SpO2 99%   BMI 26.93 kg/m    Subjective:    Patient ID: Gail Harper, female    DOB: 1965-11-01, 58 y.o.   MRN: [id number]  HPI: Shamonica Schadt is a 58 y.o. female  Chief Complaint  Patient presents with   Medical Management of Chronic Issues   Discussed the use of AI scribe software for clinical note transcription with the patient, who gave verbal consent to proceed.  History of Present Illness Aryella Besecker is a 58 year old female with type 2 diabetes who presents for a six month follow-up.  Glycemic control - Type 2 diabetes mellitus with last hemoglobin A1c of 6.7% - diet controlled - Monitors blood glucose once daily, occasionally twice if elevated - Uses the same lancet for one week due to limited refills -up to date on dm foot exam and microalbumin urine, due for eye exam  Hyperlipidemia management - Takes atorvastatin  40 mg daily - No adverse effects from statin therapy Lipid Panel     Component Value Date/Time   CHOL 168 06/11/2023 1335   CHOL 149 11/19/2018 0912   TRIG 282 (H) 06/11/2023 1335   HDL 41 (L) 06/11/2023 1335   HDL 46 11/19/2018 0912   CHOLHDL 4.1 06/11/2023 1335   LDLCALC 90 06/11/2023 1335   LABVLDL 27 11/19/2018 0912     Chronic neck pain - Chronic neck pain managed with meloxicam  15 mg as needed, typically at night - Uses acetaminophen during the day for additional pain relief  Allergic rhinitis - Takes loratadine  10 mg daily, purchased over the counter  Blood pressure elevation - Blood pressure in clinic today measured at 140/98 mmHg - Home blood pressure readings typically in the low 130s or high 120s - Previous blood pressure readings: October 124/74 mmHg, May 122/84 mmHg     BP Readings from Last 3 Encounters:  07/31/24 136/88  06/23/24 124/74  01/18/24 122/84       07/31/2024   10:09 AM 07/24/2023     7:48 AM 06/11/2023   12:58 PM  Depression screen PHQ 2/9  Decreased Interest 0 0 0  Down, Depressed, Hopeless 0 0 0  PHQ - 2 Score 0 0 0  Altered sleeping  0 2  Tired, decreased energy  0 1  Change in appetite  0 0  Feeling bad or failure about yourself   0 0  Trouble concentrating  0 0  Moving slowly or fidgety/restless  0 0  Suicidal thoughts  0 0  PHQ-9 Score  0  3   Difficult doing work/chores  Not difficult at all Not difficult at all     Data saved with a previous flowsheet row definition    Relevant past medical, surgical, family and social history reviewed and updated as indicated. Interim medical history since our last visit reviewed. Allergies and medications reviewed and updated.  Review of Systems  Constitutional: Negative for fever or weight change.  Respiratory: Negative for cough and shortness of breath.   Cardiovascular: Negative for chest pain or palpitations.  Gastrointestinal: Negative for abdominal pain, no bowel changes.  Musculoskeletal: Negative for gait problem or joint swelling.  Skin: Negative for rash.  Neurological: Negative for dizziness or headache.  No other specific complaints in a complete review of systems (except  as listed in HPI above).      Objective:      BP 136/88   Pulse 67   Temp 98 F (36.7 C)   Ht 5' 3 (1.6 m)   Wt 152 lb (68.9 kg)   LMP 10/17/2016   SpO2 99%   BMI 26.93 kg/m    Wt Readings from Last 3 Encounters:  07/31/24 152 lb (68.9 kg)  06/23/24 150 lb (68 kg)  01/18/24 155 lb (70.3 kg)    Physical Exam VITALS: BP- 140/98 GENERAL: Alert, cooperative, well developed, no acute distress. HEENT: Normocephalic, normal oropharynx, moist mucous membranes. CHEST: Clear to auscultation bilaterally, no wheezes, rhonchi, or crackles. CARDIOVASCULAR: Normal heart rate and rhythm, S1 and S2 normal without murmurs. ABDOMEN: Soft, non-tender, non-distended, without organomegaly, normal bowel sounds. EXTREMITIES: No cyanosis  or edema. NEUROLOGICAL: Cranial nerves grossly intact, moves all extremities without gross motor or sensory deficit.          Assessment & Plan:   Problem List Items Addressed This Visit       Respiratory   Allergic rhinitis     Endocrine   Type 2 diabetes mellitus without complication, without long-term current use of insulin (HCC) - Primary   Relevant Medications   atorvastatin  (LIPITOR) 40 MG tablet   Lancets Misc. MISC   Other Relevant Orders   Ambulatory referral to Ophthalmology   Comprehensive metabolic panel with GFR   Hemoglobin A1c     Other   Chronic neck pain   Relevant Medications   meloxicam  (MOBIC ) 15 MG tablet   HLD (hyperlipidemia)   Relevant Medications   atorvastatin  (LIPITOR) 40 MG tablet   Other Relevant Orders   Lipid panel   Other Visit Diagnoses       Right-sided thoracic back pain, unspecified chronicity       Relevant Medications   meloxicam  (MOBIC ) 15 MG tablet     Screening for deficiency anemia       Relevant Orders   CBC with Differential/Platelet        Assessment and Plan Assessment & Plan Type 2 diabetes mellitus Last A1c of 6.7%. No adverse reactions reported. Blood sugar monitoring is done once daily before meals, with occasional checks twice daily if levels are high. - Sent refills for lancets to pharmacy -getting labs  Mixed hyperlipidemia Managed with atorvastatin  40 mg daily. Tolerating medication well. - Sent refill for atorvastatin  to pharmacy -getting labs  Chronic neck and back pain Managed with meloxicam  15 mg as needed, taken at night. Tylenol is used during the day for additional pain relief. - Continue meloxicam  as needed for pain management  Allergic rhinitis Managed with loratadine  10 mg daily, which is over-the-counter. - Continue loratadine  as needed  Elevated blood pressure 140/98 today, possibly due to anxiety about the appointment. Home readings are typically in the low 130s to high 120s  systolic. - Rechecked blood pressure before leaving the clinic -monitor blood pressure at home,  try and reduce sodium in your diet         Follow up plan: Return in about 6 months (around 01/28/2025) for follow up.

## 2024-08-01 ENCOUNTER — Ambulatory Visit: Payer: Self-pay | Admitting: Nurse Practitioner

## 2024-08-01 LAB — CBC WITH DIFFERENTIAL/PLATELET
Absolute Lymphocytes: 3524 {cells}/uL (ref 850–3900)
Absolute Monocytes: 773 {cells}/uL (ref 200–950)
Basophils Absolute: 64 {cells}/uL (ref 0–200)
Basophils Relative: 0.7 %
Eosinophils Absolute: 110 {cells}/uL (ref 15–500)
Eosinophils Relative: 1.2 %
HCT: 35.4 % (ref 35.0–45.0)
Hemoglobin: 11.3 g/dL — ABNORMAL LOW (ref 11.7–15.5)
MCH: 26.8 pg — ABNORMAL LOW (ref 27.0–33.0)
MCHC: 31.9 g/dL — ABNORMAL LOW (ref 32.0–36.0)
MCV: 84.1 fL (ref 80.0–100.0)
MPV: 10.9 fL (ref 7.5–12.5)
Monocytes Relative: 8.4 %
Neutro Abs: 4729 {cells}/uL (ref 1500–7800)
Neutrophils Relative %: 51.4 %
Platelets: 284 Thousand/uL (ref 140–400)
RBC: 4.21 Million/uL (ref 3.80–5.10)
RDW: 12.8 % (ref 11.0–15.0)
Total Lymphocyte: 38.3 %
WBC: 9.2 Thousand/uL (ref 3.8–10.8)

## 2024-08-01 LAB — COMPREHENSIVE METABOLIC PANEL WITH GFR
AG Ratio: 1.8 (calc) (ref 1.0–2.5)
ALT: 15 U/L (ref 6–29)
AST: 16 U/L (ref 10–35)
Albumin: 4.4 g/dL (ref 3.6–5.1)
Alkaline phosphatase (APISO): 91 U/L (ref 37–153)
BUN: 16 mg/dL (ref 7–25)
CO2: 32 mmol/L (ref 20–32)
Calcium: 10.4 mg/dL (ref 8.6–10.4)
Chloride: 103 mmol/L (ref 98–110)
Creat: 0.58 mg/dL (ref 0.50–1.05)
Globulin: 2.5 g/dL (ref 1.9–3.7)
Glucose, Bld: 83 mg/dL (ref 65–99)
Potassium: 4.1 mmol/L (ref 3.5–5.3)
Sodium: 141 mmol/L (ref 135–146)
Total Bilirubin: 0.7 mg/dL (ref 0.2–1.2)
Total Protein: 6.9 g/dL (ref 6.1–8.1)
eGFR: 104 mL/min/1.73m2 (ref >=60)

## 2024-08-01 LAB — HEMOGLOBIN A1C
Hgb A1c MFr Bld: 6.2 % — ABNORMAL HIGH (ref <5.7)
Mean Plasma Glucose: 131 mg/dL
eAG (mmol/L): 7.3 mmol/L

## 2024-08-01 LAB — LIPID PANEL
Cholesterol: 150 mg/dL (ref <200)
HDL: 41 mg/dL — ABNORMAL LOW (ref >=50)
LDL Cholesterol (Calc): 86 mg/dL
Non-HDL Cholesterol (Calc): 109 mg/dL (ref <130)
Total CHOL/HDL Ratio: 3.7 (calc) (ref <5.0)
Triglycerides: 136 mg/dL (ref <150)

## 2025-01-28 ENCOUNTER — Ambulatory Visit: Admitting: Nurse Practitioner
